# Patient Record
Sex: Female | Born: 1965 | Race: White | Hispanic: No | Marital: Married | Smoking: Never smoker
Health system: Southern US, Community
[De-identification: ages and names within clinical notes are randomized; demographics above are authoritative.]

## PROBLEM LIST (undated history)

## (undated) DIAGNOSIS — G4730 Sleep apnea, unspecified: Secondary | ICD-10-CM

## (undated) DIAGNOSIS — I10 Essential (primary) hypertension: Secondary | ICD-10-CM

## (undated) DIAGNOSIS — K219 Gastro-esophageal reflux disease without esophagitis: Secondary | ICD-10-CM

## (undated) HISTORY — PX: OTHER SURGICAL HISTORY: SHX169

## (undated) HISTORY — PX: TONSILLECTOMY: SUR1361

## (undated) HISTORY — PX: GANGLION CYST EXCISION: SHX1691

## (undated) HISTORY — DX: Essential (primary) hypertension: I10

## (undated) HISTORY — DX: Gastro-esophageal reflux disease without esophagitis: K21.9

## (undated) HISTORY — PX: CHOLECYSTECTOMY: SHX55

---

## 1997-09-18 ENCOUNTER — Encounter: Admission: RE | Admit: 1997-09-18 | Discharge: 1997-09-18 | Payer: Self-pay | Admitting: *Deleted

## 1997-10-30 ENCOUNTER — Other Ambulatory Visit: Admission: RE | Admit: 1997-10-30 | Discharge: 1997-10-30 | Payer: Self-pay | Admitting: Obstetrics and Gynecology

## 1998-01-03 ENCOUNTER — Ambulatory Visit (HOSPITAL_COMMUNITY): Admission: RE | Admit: 1998-01-03 | Discharge: 1998-01-03 | Payer: Self-pay | Admitting: Obstetrics and Gynecology

## 1998-01-03 ENCOUNTER — Encounter: Payer: Self-pay | Admitting: Obstetrics and Gynecology

## 1998-11-19 ENCOUNTER — Other Ambulatory Visit: Admission: RE | Admit: 1998-11-19 | Discharge: 1998-11-19 | Payer: Self-pay | Admitting: Obstetrics and Gynecology

## 1999-01-07 ENCOUNTER — Encounter: Payer: Self-pay | Admitting: Obstetrics and Gynecology

## 1999-01-07 ENCOUNTER — Ambulatory Visit (HOSPITAL_COMMUNITY): Admission: RE | Admit: 1999-01-07 | Discharge: 1999-01-07 | Payer: Self-pay | Admitting: Obstetrics and Gynecology

## 1999-01-10 ENCOUNTER — Encounter: Payer: Self-pay | Admitting: Obstetrics and Gynecology

## 1999-01-10 ENCOUNTER — Ambulatory Visit (HOSPITAL_COMMUNITY): Admission: RE | Admit: 1999-01-10 | Discharge: 1999-01-10 | Payer: Self-pay | Admitting: Obstetrics and Gynecology

## 1999-12-05 ENCOUNTER — Other Ambulatory Visit: Admission: RE | Admit: 1999-12-05 | Discharge: 1999-12-05 | Payer: Self-pay | Admitting: Obstetrics & Gynecology

## 2000-01-10 ENCOUNTER — Encounter: Payer: Self-pay | Admitting: Obstetrics and Gynecology

## 2000-01-10 ENCOUNTER — Ambulatory Visit (HOSPITAL_COMMUNITY): Admission: RE | Admit: 2000-01-10 | Discharge: 2000-01-10 | Payer: Self-pay | Admitting: Obstetrics and Gynecology

## 2000-12-04 ENCOUNTER — Other Ambulatory Visit: Admission: RE | Admit: 2000-12-04 | Discharge: 2000-12-04 | Payer: Self-pay | Admitting: Obstetrics and Gynecology

## 2001-02-23 ENCOUNTER — Ambulatory Visit (HOSPITAL_COMMUNITY): Admission: RE | Admit: 2001-02-23 | Discharge: 2001-02-23 | Payer: Self-pay | Admitting: Obstetrics and Gynecology

## 2001-02-23 ENCOUNTER — Encounter: Payer: Self-pay | Admitting: Obstetrics and Gynecology

## 2001-12-07 ENCOUNTER — Other Ambulatory Visit: Admission: RE | Admit: 2001-12-07 | Discharge: 2001-12-07 | Payer: Self-pay | Admitting: Obstetrics and Gynecology

## 2002-02-25 ENCOUNTER — Encounter: Payer: Self-pay | Admitting: Obstetrics and Gynecology

## 2002-02-25 ENCOUNTER — Ambulatory Visit (HOSPITAL_COMMUNITY): Admission: RE | Admit: 2002-02-25 | Discharge: 2002-02-25 | Payer: Self-pay | Admitting: Obstetrics and Gynecology

## 2002-12-14 ENCOUNTER — Other Ambulatory Visit: Admission: RE | Admit: 2002-12-14 | Discharge: 2002-12-14 | Payer: Self-pay | Admitting: Obstetrics and Gynecology

## 2003-02-27 ENCOUNTER — Ambulatory Visit (HOSPITAL_COMMUNITY): Admission: RE | Admit: 2003-02-27 | Discharge: 2003-02-27 | Payer: Self-pay | Admitting: Obstetrics and Gynecology

## 2004-08-09 ENCOUNTER — Other Ambulatory Visit: Admission: RE | Admit: 2004-08-09 | Discharge: 2004-08-09 | Payer: Self-pay | Admitting: Obstetrics and Gynecology

## 2005-08-11 ENCOUNTER — Other Ambulatory Visit: Admission: RE | Admit: 2005-08-11 | Discharge: 2005-08-11 | Payer: Self-pay | Admitting: Obstetrics and Gynecology

## 2010-02-10 ENCOUNTER — Encounter: Payer: Self-pay | Admitting: Obstetrics and Gynecology

## 2011-03-03 ENCOUNTER — Ambulatory Visit (HOSPITAL_BASED_OUTPATIENT_CLINIC_OR_DEPARTMENT_OTHER): Payer: Self-pay

## 2011-03-03 ENCOUNTER — Other Ambulatory Visit: Payer: Self-pay | Admitting: Obstetrics and Gynecology

## 2011-03-03 DIAGNOSIS — Z1231 Encounter for screening mammogram for malignant neoplasm of breast: Secondary | ICD-10-CM

## 2011-03-05 ENCOUNTER — Ambulatory Visit (HOSPITAL_BASED_OUTPATIENT_CLINIC_OR_DEPARTMENT_OTHER)
Admission: RE | Admit: 2011-03-05 | Discharge: 2011-03-05 | Disposition: A | Discharge status: Home / Self Care | Payer: 59 | Source: Ambulatory Visit | Attending: Obstetrics and Gynecology | Admitting: Obstetrics and Gynecology

## 2011-03-05 DIAGNOSIS — Z1231 Encounter for screening mammogram for malignant neoplasm of breast: Secondary | ICD-10-CM | POA: Insufficient documentation

## 2012-11-17 ENCOUNTER — Other Ambulatory Visit: Payer: Self-pay | Admitting: Obstetrics and Gynecology

## 2012-11-17 DIAGNOSIS — Z1231 Encounter for screening mammogram for malignant neoplasm of breast: Secondary | ICD-10-CM

## 2012-11-26 ENCOUNTER — Ambulatory Visit (HOSPITAL_BASED_OUTPATIENT_CLINIC_OR_DEPARTMENT_OTHER)
Admission: RE | Admit: 2012-11-26 | Discharge: 2012-11-26 | Disposition: A | Discharge status: Home / Self Care | Payer: 59 | Source: Ambulatory Visit | Attending: Obstetrics and Gynecology | Admitting: Obstetrics and Gynecology

## 2012-11-26 DIAGNOSIS — Z1231 Encounter for screening mammogram for malignant neoplasm of breast: Secondary | ICD-10-CM | POA: Insufficient documentation

## 2013-07-16 DIAGNOSIS — F5101 Primary insomnia: Secondary | ICD-10-CM | POA: Diagnosis present

## 2013-07-16 DIAGNOSIS — G4700 Insomnia, unspecified: Secondary | ICD-10-CM | POA: Insufficient documentation

## 2013-07-16 DIAGNOSIS — J302 Other seasonal allergic rhinitis: Secondary | ICD-10-CM | POA: Insufficient documentation

## 2013-07-16 DIAGNOSIS — K219 Gastro-esophageal reflux disease without esophagitis: Secondary | ICD-10-CM

## 2013-07-16 DIAGNOSIS — D134 Benign neoplasm of liver: Secondary | ICD-10-CM | POA: Insufficient documentation

## 2013-07-16 DIAGNOSIS — K439 Ventral hernia without obstruction or gangrene: Secondary | ICD-10-CM | POA: Insufficient documentation

## 2013-07-16 DIAGNOSIS — R002 Palpitations: Secondary | ICD-10-CM | POA: Insufficient documentation

## 2013-07-16 DIAGNOSIS — K432 Incisional hernia without obstruction or gangrene: Secondary | ICD-10-CM | POA: Diagnosis present

## 2013-07-16 DIAGNOSIS — I10 Essential (primary) hypertension: Secondary | ICD-10-CM | POA: Insufficient documentation

## 2014-01-17 ENCOUNTER — Other Ambulatory Visit (HOSPITAL_BASED_OUTPATIENT_CLINIC_OR_DEPARTMENT_OTHER): Payer: Self-pay | Admitting: Obstetrics and Gynecology

## 2014-01-17 ENCOUNTER — Ambulatory Visit (HOSPITAL_BASED_OUTPATIENT_CLINIC_OR_DEPARTMENT_OTHER)
Admission: RE | Admit: 2014-01-17 | Discharge: 2014-01-17 | Disposition: A | Discharge status: Home / Self Care | Payer: 59 | Source: Ambulatory Visit | Attending: Obstetrics and Gynecology | Admitting: Obstetrics and Gynecology

## 2014-01-17 DIAGNOSIS — Z1231 Encounter for screening mammogram for malignant neoplasm of breast: Secondary | ICD-10-CM | POA: Diagnosis present

## 2015-05-14 DIAGNOSIS — R0683 Snoring: Secondary | ICD-10-CM | POA: Insufficient documentation

## 2015-06-04 ENCOUNTER — Encounter: Payer: Self-pay | Admitting: Internal Medicine

## 2015-06-04 ENCOUNTER — Ambulatory Visit (INDEPENDENT_AMBULATORY_CARE_PROVIDER_SITE_OTHER): Payer: Managed Care, Other (non HMO) | Admitting: Internal Medicine

## 2015-06-04 VITALS — BP 136/88 | HR 88 | Temp 98.4°F | Resp 16 | Ht 65.5 in | Wt 254.6 lb

## 2015-06-04 DIAGNOSIS — J310 Chronic rhinitis: Secondary | ICD-10-CM | POA: Diagnosis not present

## 2015-06-04 LAB — CBC WITH DIFFERENTIAL/PLATELET
BASOS PCT: 1 %
Basophils Absolute: 63 cells/uL (ref 0–200)
EOS PCT: 2 %
Eosinophils Absolute: 126 cells/uL (ref 15–500)
HCT: 35 % (ref 35.0–45.0)
HEMOGLOBIN: 11.4 g/dL — AB (ref 11.7–15.5)
LYMPHS ABS: 1575 {cells}/uL (ref 850–3900)
LYMPHS PCT: 25 %
MCH: 27 pg (ref 27.0–33.0)
MCHC: 32.6 g/dL (ref 32.0–36.0)
MCV: 82.9 fL (ref 80.0–100.0)
MPV: 9.2 fL (ref 7.5–12.5)
Monocytes Absolute: 441 cells/uL (ref 200–950)
Monocytes Relative: 7 %
NEUTROS PCT: 65 %
Neutro Abs: 4095 cells/uL (ref 1500–7800)
Platelets: 289 10*3/uL (ref 140–400)
RBC: 4.22 MIL/uL (ref 3.80–5.10)
RDW: 14.7 % (ref 11.0–15.0)
WBC: 6.3 10*3/uL (ref 3.8–10.8)

## 2015-06-04 LAB — COMPLETE METABOLIC PANEL WITH GFR
ALT: 26 U/L (ref 6–29)
AST: 22 U/L (ref 10–35)
Albumin: 4 g/dL (ref 3.6–5.1)
Alkaline Phosphatase: 73 U/L (ref 33–130)
BUN: 10 mg/dL (ref 7–25)
CHLORIDE: 102 mmol/L (ref 98–110)
CO2: 22 mmol/L (ref 20–31)
Calcium: 9 mg/dL (ref 8.6–10.4)
Creat: 0.66 mg/dL (ref 0.50–1.05)
GFR, Est African American: >89 mL/min (ref >=60)
GFR, Est Non African American: >89 mL/min (ref >=60)
GLUCOSE: 123 mg/dL — AB (ref 65–99)
POTASSIUM: 3.9 mmol/L (ref 3.5–5.3)
SODIUM: 139 mmol/L (ref 135–146)
Total Bilirubin: 0.4 mg/dL (ref 0.2–1.2)
Total Protein: 6.4 g/dL (ref 6.1–8.1)

## 2015-06-04 MED ORDER — LEVOCETIRIZINE DIHYDROCHLORIDE 5 MG PO TABS: 5.0000 mg | ORAL_TABLET | Freq: Every evening | ORAL | Status: DC

## 2015-06-04 MED ORDER — OLOPATADINE HCL 0.6 % NA SOLN: 2.0000 | Freq: Two times a day (BID) | NASAL | Status: DC

## 2015-06-04 MED ORDER — OLOPATADINE HCL 0.6 % NA SOLN
2.0000 | Freq: Two times a day (BID) | NASAL | Status: DC
Start: 2015-06-04 — End: 2015-06-04

## 2015-06-04 NOTE — Assessment & Plan Note (Addendum)
   Start Xyzal 5 mg daily, Patanase 2 spray each nostril twice a day, ketotifen eyedrops 1 drop each eye twice a day  Continue Nasacort 2 sprays each nostril daily, Singulair 10 mg daily  Screen immune system. Check CBC with differential, CMP, ESR, total Ig G/A/M/E, tetanus/pneumococcal/diphtheria titers. She has never had a Pneumovax

## 2015-06-04 NOTE — Progress Notes (Signed)
Referring provider: No referring provider defined for this encounter.  History of Present Illness:  Theresa Gilbert is a 50 y.o. female presenting for evaluation of rhinitis  HPI Comments: Allergic rhinitis: Patient had testing is 17 years ago in [place] and was reportedly positive for dust mite, feathers, mold. She is taking Zyrtec, Singulair, Nasacort or Flonase all year but continues to have breakthrough symptoms. She has sinus infections a few times a year requiring antibiotics. She does not have any other types of infections. She was never on allergy injections. She saw an ear nose and throat physician one and a half years ago who perform sinus imaging that was reportedly negative with the exception of mildly enlarged adenoids. She has unrestful sleep and a sleep study is pending for next month.   Assessment and Plan: Chronic rhinitis  Start Xyzal 5 mg daily, Patanase 2 spray each nostril twice a day, ketotifen eyedrops 1 drop each eye twice a day  Continue Nasacort 2 sprays each nostril daily, Singulair 10 mg daily  Screen immune system. Check CBC with differential, CMP, ESR, total Ig G/A/M/E, tetanus/pneumococcal/diphtheria titers. She has never had a Pneumovax    Return in about 6 months (around 12/05/2015).  No current outpatient prescriptions on file prior to visit.   No current facility-administered medications on file prior to visit.    Meds ordered this encounter  Medications  . losartan (COZAAR) 25 MG tablet    Sig: Take 25 mg by mouth daily.  Marland Kitchen esomeprazole (NEXIUM) 20 MG capsule    Sig: Take 20 mg by mouth 2 (two) times daily.  . montelukast (SINGULAIR) 10 MG tablet    Sig: Take 10 mg by mouth at bedtime.  Marland Kitchen DISCONTD: cetirizine (ZYRTEC) 10 MG tablet    Sig: Take 10 mg by mouth daily.  . Calcium Citrate-Vitamin D (CALCIUM + D PO)    Sig: Take by mouth.  . Multiple Vitamins-Minerals (MULTIVITAMIN GUMMIES ADULTS PO)    Sig: Take by mouth.  .  Cyanocobalamin (VITAMIN B-12 PO)    Sig: Take by mouth.  . DISCONTD: levocetirizine (XYZAL) 5 MG tablet    Sig: Take 1 tablet (5 mg total) by mouth every evening.    Dispense:  60 tablet    Refill:  0    For runny nose or itching. Dispense 60 day supply.  Marland Kitchen DISCONTD: Olopatadine HCl 0.6 % SOLN    Sig: Place 2 drops (2 puffs total) into both nostrils 2 (two) times daily.    Dispense:  2 Bottle    Refill:  0    Dispense 60 supply. For runny nose  . DISCONTD: Olopatadine HCl 0.6 % SOLN    Sig: Place 2 drops (2 puffs total) into both nostrils 2 (two) times daily.    Dispense:  2 Bottle    Refill:  0    Dispense 60 supply. For runny nose  . levocetirizine (XYZAL) 5 MG tablet    Sig: Take 1 tablet (5 mg total) by mouth every evening.    Dispense:  30 tablet    Refill:  0    For runny nose or itching.  Marland Kitchen DISCONTD: Olopatadine HCl 0.6 % SOLN    Sig: Place 2 drops (2 puffs total) into both nostrils 2 (two) times daily.    Dispense:  1 Bottle    Refill:  0    For runny nose.  Marland Kitchen Olopatadine HCl 0.6 % SOLN    Sig: Place 2 drops (2 puffs total) into  both nostrils 2 (two) times daily.    Dispense:  1 Bottle    Refill:  0    For runny nose.    Diagnostics: Aeroallergen skin testing: Borderline positive to 1 weed with a good histamine control Food allergy skin testing: Negative with a good histamine control  Skin tests were interpreted by me, transferred into [facility] by CMA, reviewed and accepted by me into EP[facility].  Physical Exam: BP 136/88 mmHg  Pulse 88  Temp(Src) 98.4 F (36.9 C) (Oral)  Resp 16  Ht 5' 5.5" (1.664 m)  Wt 254 lb 9.6 oz (115.486 kg)  BMI 41.71 kg/m2   Physical Exam  Constitutional: She appears well-developed and well-nourished. No distress.  HENT:  Right Ear: External ear normal.  Left Ear: External ear normal.  Nose: Nose normal.  Mouth/Throat: Oropharynx is clear and moist.  Eyes: Conjunctivae are normal. Right eye exhibits no discharge. Left eye exhibits no  discharge.  Cardiovascular: Normal rate, regular rhythm and normal heart sounds.   No murmur heard. Pulmonary/Chest: Effort normal and breath sounds normal. No respiratory distress. She has no wheezes. She has no rales.  Abdominal: Soft. Bowel sounds are normal.  Musculoskeletal: She exhibits no edema.  Lymphadenopathy:    She has no cervical adenopathy.  Neurological: She is alert.  Skin: No rash noted.  Vitals reviewed.   Review of systems: Per HPI unless specifically indicated below Review of Systems  Constitutional: Negative for fever, chills, appetite change and unexpected weight change.  HENT: Positive for congestion, postnasal drip, rhinorrhea, sinus pressure and sneezing. Negative for ear pain and sore throat.   Eyes: Positive for discharge and itching. Negative for pain.  Respiratory: Negative for cough, chest tightness and wheezing.   Cardiovascular: Negative for chest pain and leg swelling.  Gastrointestinal: Negative for vomiting and diarrhea.  Genitourinary: Negative for difficulty urinating.  Musculoskeletal: Negative for joint swelling and arthralgias.  Skin: Negative for rash.  Allergic/Immunologic: Positive for environmental allergies. Negative for food allergies and immunocompromised state.       Stung by bee wasp yellow jacket - no problem No latex allergy  Neurological: Negative for seizures.    Patient Active Problem List   Diagnosis Date Noted  . Chronic rhinitis 06/04/2015    Past Surgical History  Procedure Laterality Date  . Cholecytectomy    . Ganglion cyst excision Left     wrist,  . Arthritic cyst Left     wrist  . Wisdom teeth extaction      Family History  Problem Relation Age of Onset  . Cancer Mother   . Diabetes Mother   . Heart disease Father   . Cancer Sister   . Heart disease Maternal Grandmother   . Heart disease Maternal Grandfather     Environmental/Social history: She lives in a house that is 52 years of age, there is no  basement in the home, there is carpet in the bedroom, there is central air conditioning and heating, there are no pets in the home, there are allergy covers over the mattress and pillow, she is a lifelong nonsmoker, she works in an office as a IT sales professional.  Drug Allergies:  No Known Allergies  Thank you for the opportunity to care for this patient.  Please do not hesitate to contact me with questions.

## 2015-06-04 NOTE — Patient Instructions (Addendum)
Chronic rhinitis  Start Xyzal 5 mg daily, Patanase 2 spray each nostril twice a day, ketotifen eyedrops 1 drop each eye twice a day  Continue Nasacort 2 sprays each nostril daily, Singulair 10 mg daily  Screen immune system. Check CBC with differential, CMP, ESR, total IgG G/A/M/E, tetanus/pneumococcal/diphtheria titers. She has never had a Pneumovax

## 2015-06-05 LAB — SEDIMENTATION RATE: Sed Rate: 9 mm/hr (ref 0–30)

## 2015-06-05 LAB — IGM: IGM, SERUM: 82 mg/dL (ref 48–271)

## 2015-06-05 LAB — IGG: IgG (Immunoglobin G), Serum: 1101 mg/dL (ref 694–1618)

## 2015-06-05 LAB — IGA: IGA: 125 mg/dL (ref 81–463)

## 2015-06-05 LAB — IGE: IgE (Immunoglobulin E), Serum: 35 kU/L (ref <115)

## 2015-06-06 LAB — DIPHTHERIA / TETANUS ANTIBODY PANEL
DIPHTHERIA AB: 0.52 [IU]/mL
TETANUS TOXIN ANTIBODY, TOTAL: 3.04 [IU]/mL (ref >0.15)

## 2015-06-11 LAB — STREP PNEUMONIAE 23 SEROTYPES IGG
SEROTYPE 1 (1): 3.2 ug/mL
SEROTYPE 12 (12F): 0.9 ug/mL
SEROTYPE 14 (14): 0.3 ug/mL
SEROTYPE 19 (19F): 2.5 ug/mL
SEROTYPE 2 (2): 1.3 ug/mL
SEROTYPE 22 (22F): 0.3 ug/mL
SEROTYPE 26 (6B): 0.4 ug/mL
SEROTYPE 57 (19A): 0.3 ug/mL
SEROTYPE 70 (33F): 1.8 ug/mL
Serotype 17 (17F): 0.7 ug/mL
Serotype 23 (23F): 1 ug/mL
Serotype 3 (3): <0.3 ug/mL
Serotype 34 (10A): 2.8 ug/mL
Serotype 4 (4): 0.7 ug/mL
Serotype 43 (11A): 0.5 ug/mL
Serotype 5 (5): 3.6 ug/mL
Serotype 51 (7F): <0.3 ug/mL
Serotype 56 (18C): 4.2 ug/mL
Serotype 8 (8): 0.9 ug/mL
Serotype 9 (9N): <0.3 ug/mL

## 2015-06-11 LAB — STREP PNEUMONIAE 14 SEROTYPES IGG
STREP PNEUMO TYPE 19: 2.5 ug/mL
STREP PNEUMO TYPE 4: 0.7 ug/mL
STREP PNEUMONIAE TYPE 6B ABS: 0.4 ug/mL
STREP PNEUMONIAE TYPE 8 ABS: 0.9 ug/mL
Strep pneumo Type 12: 0.9 ug/mL
Strep pneumoniae Type 1 Abs: 3.2 ug/mL
Strep pneumoniae Type 14 Abs: 0.3 ug/mL
Strep pneumoniae Type 18C Abs: 4.2 ug/mL
Strep pneumoniae Type 23F Abs: 1 ug/mL
Strep pneumoniae Type 5 Abs: 3.6 ug/mL
Strep pneumoniae Type 7F Abs: <0.3 ug/mL
Strep pneumoniae Type 9N Abs: <0.3 ug/mL

## 2015-07-25 ENCOUNTER — Telehealth: Payer: Self-pay | Admitting: Internal Medicine

## 2015-09-16 ENCOUNTER — Emergency Department
Admission: EM | Admit: 2015-09-16 | Discharge: 2015-09-16 | Disposition: A | Discharge status: Home / Self Care | Payer: 59 | Attending: Student in an Organized Health Care Education/Training Program | Admitting: Student in an Organized Health Care Education/Training Program

## 2015-09-16 ENCOUNTER — Encounter: Payer: Self-pay | Admitting: Emergency Medicine

## 2015-09-16 ENCOUNTER — Emergency Department: Payer: 59

## 2015-09-16 DIAGNOSIS — Z79899 Other long term (current) drug therapy: Secondary | ICD-10-CM | POA: Insufficient documentation

## 2015-09-16 DIAGNOSIS — R079 Chest pain, unspecified: Secondary | ICD-10-CM | POA: Insufficient documentation

## 2015-09-16 DIAGNOSIS — I10 Essential (primary) hypertension: Secondary | ICD-10-CM | POA: Insufficient documentation

## 2015-09-16 DIAGNOSIS — R002 Palpitations: Secondary | ICD-10-CM | POA: Diagnosis present

## 2015-09-16 LAB — CBC
HEMATOCRIT: 37 % (ref 35.0–47.0)
HEMOGLOBIN: 12.7 g/dL (ref 12.0–16.0)
MCH: 28.6 pg (ref 26.0–34.0)
MCHC: 34.4 g/dL (ref 32.0–36.0)
MCV: 83 fL (ref 80.0–100.0)
Platelets: 227 10*3/uL (ref 150–440)
RBC: 4.45 MIL/uL (ref 3.80–5.20)
RDW: 14.5 % (ref 11.5–14.5)
WBC: 5 10*3/uL (ref 3.6–11.0)

## 2015-09-16 LAB — BASIC METABOLIC PANEL
Anion gap: 6 (ref 5–15)
BUN: 14 mg/dL (ref 6–20)
CHLORIDE: 105 mmol/L (ref 101–111)
CO2: 27 mmol/L (ref 22–32)
CREATININE: 0.74 mg/dL (ref 0.44–1.00)
Calcium: 9.3 mg/dL (ref 8.9–10.3)
GFR calc non Af Amer: >60 mL/min (ref >60)
Glucose, Bld: 120 mg/dL — ABNORMAL HIGH (ref 65–99)
POTASSIUM: 3.8 mmol/L (ref 3.5–5.1)
SODIUM: 138 mmol/L (ref 135–145)

## 2015-09-16 LAB — MAGNESIUM: MAGNESIUM: 2 mg/dL (ref 1.7–2.4)

## 2015-09-16 LAB — TROPONIN I: Troponin I: <0.03 ng/mL (ref <0.03)

## 2015-09-16 LAB — FIBRIN DERIVATIVES D-DIMER (ARMC ONLY): FIBRIN DERIVATIVES D-DIMER (ARMC): 372 (ref 0–499)

## 2015-09-16 MED ORDER — GI COCKTAIL ~~LOC~~
30.0000 mL | Freq: Once | ORAL | Status: AC
  Administered 2015-09-16: 30 mL via ORAL
  Filled 2015-09-16: qty 30

## 2015-09-16 MED ORDER — SODIUM CHLORIDE 0.9 % IV BOLUS (SEPSIS)
1000.0000 mL | Freq: Once | INTRAVENOUS | Status: AC
  Administered 2015-09-16: 1000 mL via INTRAVENOUS

## 2015-09-16 NOTE — ED Provider Notes (Signed)
[facility] Emergency Department Provider Note    First MD Initiated Contact with Patient 09/16/15 1015     (approximate)  I have reviewed the triage vital signs and the nursing notes.   HISTORY  Chief Complaint Chest Pain    HPI Theresa Gilbert is a 50 y.o. female with a history of GERD and hypertension presents with 15 minutes of midsternal pressure, nausea and lightheadedness and palpitations that occurred roughly at 10:00 while the patient was driving to the flea market. Patient has never had these symptoms before. Patient states that she is undergoing a weight loss program and has had worsening reflux symptoms over the past 2 weeks but this felt different. Does not have any history of dysrhythmia. No recent medication changes. Does not feel short of breath at this time. States that she was going on a hike this morning with her husband and denied any chest pain or shortness of breath at that time. Since she arrived to the ER she has been asymptomatic. She is primarily concerned that she was having a heart attack. She does not smoke. Does not have a history of blood clots.   Past Medical History:  Diagnosis Date  . GERD (gastroesophageal reflux disease)   . Hypertension     Patient Active Problem List   Diagnosis Date Noted  . Chronic rhinitis 06/04/2015    Past Surgical History:  Procedure Laterality Date  . arthritic cyst Left    wrist  . CHOLECYSTECTOMY    . cholecytectomy    . GANGLION CYST EXCISION Left    wrist,  . wisdom teeth extaction      Prior to Admission medications   Medication Sig Start Date End Date Taking? Authorizing Provider  Calcium Citrate-Vitamin D (CALCIUM + D PO) Take by mouth.    Historical Provider, MD  Cyanocobalamin (VITAMIN B-12 PO) Take by mouth.    Historical Provider, MD  esomeprazole (NEXIUM) 20 MG capsule Take 20 mg by mouth 2 (two) times daily.    Historical Provider, MD  levocetirizine (XYZAL) 5 MG  tablet Take 1 tablet (5 mg total) by mouth every evening. 06/04/15   Leda Roys, MD  losartan (COZAAR) 25 MG tablet Take 25 mg by mouth daily.    Historical Provider, MD  montelukast (SINGULAIR) 10 MG tablet Take 10 mg by mouth at bedtime.    Historical Provider, MD  Multiple Vitamins-Minerals (MULTIVITAMIN GUMMIES ADULTS PO) Take by mouth.    Historical Provider, MD  Olopatadine HCl 0.6 % SOLN Place 2 drops (2 puffs total) into both nostrils 2 (two) times daily. 06/04/15   Leda Roys, MD    Allergies Review of patient's allergies indicates no known allergies.  Family History  Problem Relation Age of Onset  . Cancer Mother   . Diabetes Mother   . Heart disease Father   . Cancer Sister   . Heart disease Maternal Grandmother   . Heart disease Maternal Grandfather     Social History Social History  Substance Use Topics  . Smoking status: Never Smoker  . Smokeless tobacco: Never Used  . Alcohol use Yes    Review of Systems Patient denies headaches, rhinorrhea, blurry vision, numbness, shortness of breath, chest pain, edema, cough, abdominal pain, nausea, vomiting, diarrhea, dysuria, fevers, rashes or hallucinations unless otherwise stated above in HPI. ____________________________________________   PHYSICAL EXAM:  VITAL SIGNS: Vitals:   09/16/15 1009  BP: 135/83  Pulse: 89  Resp: 16  Temp: 97.8 F (36.6  C)    Constitutional: Alert and oriented. Well appearing and in no acute distress. Eyes: Conjunctivae are normal. PERRL. EOMI. Head: Atraumatic. Nose: No congestion/rhinnorhea. Mouth/Throat: Mucous membranes are moist.  Oropharynx non-erythematous. Neck: No stridor. Painless ROM. No cervical spine tenderness to palpation Hematological/Lymphatic/Immunilogical: No cervical lymphadenopathy. Cardiovascular: Normal rate, regular rhythm. Grossly normal heart sounds.  Good peripheral circulation. Respiratory: Normal respiratory effort.  No retractions. Lungs  CTAB. Gastrointestinal: Soft and nontender. No distention. No abdominal bruits. No CVA tenderness. Genitourinary:  Musculoskeletal: No lower extremity tenderness nor edema.  No joint effusions. Neurologic:  Normal speech and language. No gross focal neurologic deficits are appreciated. No gait instability. Skin:  Skin is warm, dry and intact. No rash noted. Psychiatric: Mood and affect are normal. Speech and behavior are normal.  ____________________________________________   LABS (all labs ordered are listed, but only abnormal results are displayed)  Results for orders placed or performed during the hospital encounter of 09/16/15 (from the past 24 hour(s))  Basic metabolic panel     Status: Abnormal   Collection Time: 09/16/15 10:13 AM  Result Value Ref Range   Sodium 138 135 - 145 mmol/L   Potassium 3.8 3.5 - 5.1 mmol/L   Chloride 105 101 - 111 mmol/L   CO2 27 22 - 32 mmol/L   Glucose, Bld 120 (H) 65 - 99 mg/dL   BUN 14 6 - 20 mg/dL   Creatinine, Ser 0.74 0.44 - 1.00 mg/dL   Calcium 9.3 8.9 - 10.3 mg/dL   GFR calc non Af Amer >60 >60 mL/min   GFR calc Af Amer >60 >60 mL/min   Anion gap 6 5 - 15  CBC     Status: None   Collection Time: 09/16/15 10:13 AM  Result Value Ref Range   WBC 5.0 3.6 - 11.0 K/uL   RBC 4.45 3.80 - 5.20 MIL/uL   Hemoglobin 12.7 12.0 - 16.0 g/dL   HCT 37.0 35.0 - 47.0 %   MCV 83.0 80.0 - 100.0 fL   MCH 28.6 26.0 - 34.0 pg   MCHC 34.4 32.0 - 36.0 g/dL   RDW 14.5 11.5 - 14.5 %   Platelets 227 150 - 440 K/uL  Troponin I     Status: None   Collection Time: 09/16/15 10:13 AM  Result Value Ref Range   Troponin I <0.03 <0.03 ng/mL   ____________________________________________  EKG My review and personal interpretation at Time: 10:06   Indication: chest pain  Rate: 90  Rhythm: nsr Axis: normal Other: no wpw, brugada or acute ischemic changes ____________________________________________  RADIOLOGY  CXR my read shows no evidence of acute  cardiopulmonary process.  ____________________________________________   PROCEDURES  Procedure(s) performed: none    Critical Care performed: no ____________________________________________   INITIAL IMPRESSION / ASSESSMENT AND PLAN / ED COURSE  Pertinent labs & imaging results that were available during my care of the patient were reviewed by me and considered in my medical decision making (see chart for details).  DDX: acs, pericarditis, pe, chf, dysrhythmia, dehydration, gerd, anxiety  SHOSHANA BRICE is a 50 y.o. who presents to the ED with complaint of acute shortness of breath and chest pain. Patient is low risk for heart score based on history and comorbidities. Heart score is 3. Patient is low risk Wells. We will send a d-dimer to further risk stratify for pulmonary embolism. She is currently afebrile and hemodynamically stable do not suspect acute infectious process. Presentation is not consistent with dissection as she has  no residual symptoms or noted her neuro deficits. Chest x-ray does not show any evidence of infiltrate or mass. Her abdominal exam is soft and benign. Patient does have worsening GERD symptoms over the past 2 weeks. This may be worsening episode of similar symptoms. Based on her presentation at 10:00 will further risk stratify for ACS with repeat EKG and troponin at 3 hours.  Clinical Course  Comment By Time  D-dimer is negative for acute DVT. Do not feel CT imaging clinically indicated. We'll repeat EKG and troponin to further risk stratify for ACS. Merlyn Lot, MD 08/27 1221  Repeat EKG is without acute changes. Merlyn Lot, MD 08/27 1238  Repeat troponin was negative patient continues to be asymptomatic. Feel patient is appropriate for discharge home. Patient has follow-up point with her primary care physician on Tuesday. Discussed signs and symptoms for which the patient should return emergently to the hospital.  Patient was able to  tolerate PO and was able to ambulate with a steady gait.  Merlyn Lot, MD 08/27 1321     ____________________________________________   FINAL CLINICAL IMPRESSION(S) / ED DIAGNOSES  Final diagnoses:  Chest pain, unspecified chest pain type      NEW MEDICATIONS STARTED DURING THIS VISIT:  New Prescriptions   No medications on file     Note:  This document was prepared using Dragon voice recognition software and may include unintentional dictation errors.    Merlyn Lot, MD 09/16/15 1324

## 2015-09-16 NOTE — ED Triage Notes (Signed)
C/O heart palpitations, onset of symptoms this morning.  With palpitations patient became diaphoretic and felt SOB

## 2015-09-16 NOTE — ED Notes (Signed)
Patient is a patient at [facility] bariatric clinic and is in their 12 week program.  States she has lost 35 pounds while in the program.

## 2015-09-19 NOTE — Telephone Encounter (Signed)
Made in error

## 2017-08-25 ENCOUNTER — Other Ambulatory Visit (HOSPITAL_BASED_OUTPATIENT_CLINIC_OR_DEPARTMENT_OTHER): Payer: Self-pay | Admitting: Obstetrics and Gynecology

## 2017-08-25 DIAGNOSIS — Z1231 Encounter for screening mammogram for malignant neoplasm of breast: Secondary | ICD-10-CM

## 2017-08-27 ENCOUNTER — Ambulatory Visit (HOSPITAL_BASED_OUTPATIENT_CLINIC_OR_DEPARTMENT_OTHER)
Admission: RE | Admit: 2017-08-27 | Discharge: 2017-08-27 | Disposition: A | Discharge status: Home / Self Care | Payer: 59 | Source: Ambulatory Visit | Attending: Obstetrics and Gynecology | Admitting: Obstetrics and Gynecology

## 2017-08-27 DIAGNOSIS — Z1231 Encounter for screening mammogram for malignant neoplasm of breast: Secondary | ICD-10-CM | POA: Insufficient documentation

## 2017-09-25 DIAGNOSIS — K635 Polyp of colon: Secondary | ICD-10-CM | POA: Insufficient documentation

## 2017-11-03 DIAGNOSIS — G4733 Obstructive sleep apnea (adult) (pediatric): Secondary | ICD-10-CM | POA: Diagnosis present

## 2017-11-20 HISTORY — PX: TURBINATE REDUCTION: SHX6157

## 2018-05-31 ENCOUNTER — Other Ambulatory Visit: Payer: Self-pay

## 2018-05-31 ENCOUNTER — Encounter (HOSPITAL_BASED_OUTPATIENT_CLINIC_OR_DEPARTMENT_OTHER): Payer: Self-pay | Admitting: *Deleted

## 2018-05-31 ENCOUNTER — Emergency Department (HOSPITAL_BASED_OUTPATIENT_CLINIC_OR_DEPARTMENT_OTHER): Payer: POS

## 2018-05-31 ENCOUNTER — Emergency Department (HOSPITAL_BASED_OUTPATIENT_CLINIC_OR_DEPARTMENT_OTHER)
Admission: EM | Admit: 2018-05-31 | Discharge: 2018-05-31 | Disposition: A | Discharge status: Home / Self Care | Payer: POS | Source: Home / Self Care | Attending: Emergency Medicine | Admitting: Emergency Medicine

## 2018-05-31 DIAGNOSIS — Z79899 Other long term (current) drug therapy: Secondary | ICD-10-CM | POA: Insufficient documentation

## 2018-05-31 DIAGNOSIS — R1013 Epigastric pain: Secondary | ICD-10-CM | POA: Insufficient documentation

## 2018-05-31 DIAGNOSIS — K440 Diaphragmatic hernia with obstruction, without gangrene: Secondary | ICD-10-CM | POA: Diagnosis not present

## 2018-05-31 DIAGNOSIS — R112 Nausea with vomiting, unspecified: Secondary | ICD-10-CM | POA: Insufficient documentation

## 2018-05-31 DIAGNOSIS — I10 Essential (primary) hypertension: Secondary | ICD-10-CM | POA: Insufficient documentation

## 2018-05-31 HISTORY — DX: Sleep apnea, unspecified: G47.30

## 2018-05-31 LAB — URINALYSIS, ROUTINE W REFLEX MICROSCOPIC
Bilirubin Urine: NEGATIVE
Glucose, UA: NEGATIVE mg/dL
Hgb urine dipstick: NEGATIVE
Ketones, ur: 15 mg/dL — AB
Leukocytes,Ua: NEGATIVE
Nitrite: NEGATIVE
Protein, ur: NEGATIVE mg/dL
Specific Gravity, Urine: >1.03 — ABNORMAL HIGH (ref 1.005–1.030)
pH: 6 (ref 5.0–8.0)

## 2018-05-31 LAB — CBC WITH DIFFERENTIAL/PLATELET
Abs Immature Granulocytes: 0.03 10*3/uL (ref 0.00–0.07)
Basophils Absolute: 0 10*3/uL (ref 0.0–0.1)
Basophils Relative: 0 %
Eosinophils Absolute: 0 10*3/uL (ref 0.0–0.5)
Eosinophils Relative: 0 %
HCT: 37.6 % (ref 36.0–46.0)
Hemoglobin: 11.9 g/dL — ABNORMAL LOW (ref 12.0–15.0)
Immature Granulocytes: 0 %
Lymphocytes Relative: 11 %
Lymphs Abs: 1 10*3/uL (ref 0.7–4.0)
MCH: 27.3 pg (ref 26.0–34.0)
MCHC: 31.6 g/dL (ref 30.0–36.0)
MCV: 86.2 fL (ref 80.0–100.0)
Monocytes Absolute: 0.5 10*3/uL (ref 0.1–1.0)
Monocytes Relative: 6 %
Neutro Abs: 8 10*3/uL — ABNORMAL HIGH (ref 1.7–7.7)
Neutrophils Relative %: 83 %
Platelets: 290 10*3/uL (ref 150–400)
RBC: 4.36 MIL/uL (ref 3.87–5.11)
RDW: 13.5 % (ref 11.5–15.5)
WBC: 9.5 10*3/uL (ref 4.0–10.5)
nRBC: 0 % (ref 0.0–0.2)

## 2018-05-31 LAB — COMPREHENSIVE METABOLIC PANEL
ALT: 19 U/L (ref 0–44)
AST: 21 U/L (ref 15–41)
Albumin: 4.3 g/dL (ref 3.5–5.0)
Alkaline Phosphatase: 77 U/L (ref 38–126)
Anion gap: 10 (ref 5–15)
BUN: 14 mg/dL (ref 6–20)
CO2: 26 mmol/L (ref 22–32)
Calcium: 9.5 mg/dL (ref 8.9–10.3)
Chloride: 106 mmol/L (ref 98–111)
Creatinine, Ser: 0.61 mg/dL (ref 0.44–1.00)
GFR calc Af Amer: >60 mL/min (ref >60)
GFR calc non Af Amer: >60 mL/min (ref >60)
Glucose, Bld: 127 mg/dL — ABNORMAL HIGH (ref 70–99)
Potassium: 5.1 mmol/L (ref 3.5–5.1)
Sodium: 142 mmol/L (ref 135–145)
Total Bilirubin: 0.5 mg/dL (ref 0.3–1.2)
Total Protein: 7.4 g/dL (ref 6.5–8.1)

## 2018-05-31 LAB — LIPASE, BLOOD: Lipase: 31 U/L (ref 11–51)

## 2018-05-31 MED ORDER — SUCRALFATE 1 G PO TABS: 1.0000 g | ORAL_TABLET | Freq: Three times a day (TID) | ORAL | 0 refills | Status: DC

## 2018-05-31 MED ORDER — ONDANSETRON HCL 4 MG/2ML IJ SOLN
4.0000 mg | Freq: Once | INTRAMUSCULAR | Status: AC
  Administered 2018-05-31: 4 mg via INTRAVENOUS
  Filled 2018-05-31: qty 2

## 2018-05-31 MED ORDER — SUCRALFATE 1 G PO TABS: 1.0000 g | ORAL_TABLET | Freq: Three times a day (TID) | ORAL | 0 refills | Status: AC

## 2018-05-31 MED ORDER — SIMETHICONE 40 MG/0.6ML PO SUSP (UNIT DOSE)
40.0000 mg | Freq: Once | ORAL | Status: AC
  Administered 2018-05-31: 40 mg via ORAL
  Filled 2018-05-31: qty 0.6

## 2018-05-31 MED ORDER — LIDOCAINE VISCOUS HCL 2 % MT SOLN
15.0000 mL | Freq: Once | OROMUCOSAL | Status: AC
  Administered 2018-05-31: 15 mL via ORAL
  Filled 2018-05-31: qty 15

## 2018-05-31 MED ORDER — IOHEXOL 300 MG/ML  SOLN
100.0000 mL | Freq: Once | INTRAMUSCULAR | Status: AC | PRN
  Administered 2018-05-31: 100 mL via INTRAVENOUS

## 2018-05-31 MED ORDER — ONDANSETRON 4 MG PO TBDP: 4.0000 mg | ORAL_TABLET | Freq: Three times a day (TID) | ORAL | 0 refills | Status: DC | PRN

## 2018-05-31 MED ORDER — ALUM & MAG HYDROXIDE-SIMETH 200-200-20 MG/5ML PO SUSP
30.0000 mL | Freq: Once | ORAL | Status: AC
  Administered 2018-05-31: 30 mL via ORAL
  Filled 2018-05-31: qty 30

## 2018-05-31 MED ORDER — ONDANSETRON HCL 4 MG/2ML IJ SOLN
4.0000 mg | Freq: Once | INTRAMUSCULAR | Status: AC
  Administered 2018-05-31: 21:00:00 4 mg via INTRAVENOUS
  Filled 2018-05-31: qty 2

## 2018-05-31 MED ORDER — SODIUM CHLORIDE 0.9 % IV BOLUS
1000.0000 mL | Freq: Once | INTRAVENOUS | Status: AC
  Administered 2018-05-31: 1000 mL via INTRAVENOUS

## 2018-05-31 MED ORDER — MORPHINE SULFATE (PF) 4 MG/ML IV SOLN
4.0000 mg | Freq: Once | INTRAVENOUS | Status: AC
  Administered 2018-05-31: 4 mg via INTRAVENOUS
  Filled 2018-05-31: qty 1

## 2018-05-31 NOTE — ED Notes (Signed)
Patient transported to CT 

## 2018-05-31 NOTE — ED Triage Notes (Addendum)
Pt co/ epigastric pain after eating pizza and 2 fiber one bars  x 4 hrs ago , reports nausea

## 2018-05-31 NOTE — ED Notes (Signed)
ED Provider at bedside. 

## 2018-05-31 NOTE — ED Notes (Signed)
Pt vomited following medication administration

## 2018-05-31 NOTE — Discharge Instructions (Signed)
Evaluated today for epigastric pain.  This is likely combination of reflux or possible gastric ulcer.  Please continue to take your Nexium.  Have also given you prescription for Zofran which you may take for nausea.  This is a disintegrating tablet that you placed under your tongue.  Also given you a medication called Carafate for possible peptic ulcer.  Your lab work as well as your CT scan was unremarkable, however you do have a hiatal hernia which may be the cause of your persistent reflux.  I referred you to general surgery whom you may follow-up outpatient if you desire surgical correction of this.  If you develop bloody vomiting, bloody diarrhea, increased abdominal pain please seek reevaluation.

## 2018-05-31 NOTE — ED Notes (Signed)
Pt verbalizes understanding of d/c instructions and denies any further needs at this time. 

## 2018-05-31 NOTE — ED Notes (Signed)
Tolerating PO fluids °

## 2018-05-31 NOTE — ED Notes (Signed)
Attempted two IV sticks without success 

## 2018-05-31 NOTE — ED Provider Notes (Signed)
[facility] Provider Note   CSN: [id number] Arrival date & time: 05/31/18  1723  History   Chief Complaint Chief Complaint  Patient presents with  . Abdominal Pain   HPI Theresa Gilbert is a 53 y.o. female with past medical history significant for GERD, hypertension who presents for evaluation of epigastric pain.  Patient states she has had epigastric pain since 1 PM today with increased beltching.  Patient states she ate pizza from [employer] and developed epigastric pain after eating this as well as 2 fiber one bars.  Patient states she has had previous similar episodes with dairy intake as well as fiberone bar.  Denies radiation of pain to back or shoulders. Patient states she has had 2 episodes of small-volume nonbloody, nonbilious emesis. States she did have a "normal" bowel movement this morning without melena or hematochezia.  No hematemesis. She has been tolerating PO intake at home since pain began without issues. She rates her current pain an 8/10.  Described as a nagging sensation and fullness.  She reports persistent nausea since onset of pain.  She has not take anything for pain PTA.  She denies additional aggravating or alleviating factors. She denies fever, chills, upper respiratory symptoms, chest pain, diaphoresis, lightheadedness, dizziness, cough, shortness of breath, lower abdominal pain, RUQ pain, dysuria, hematuria, diarrhea, constipation, pelvic pain.  She denies previous history of bowel obstructions.  She does have history of laparoscopic cholecystectomy approximately 6 years ago. She denies complications post surgery. Denies NSAID use, alcohol use, hx of pancreatitis. States she does have history of "very small" hiatal hernia however has had increased "fullness and heaviness" sensation in her epigastric region when ever she eat heavy foods such as pasta or bread over the last 2-3 months. No RUQ pain with greasy foods.  Last PO intake  3pm. Prior ABD surgeries- Lap Chole "Many many year ago."  Patient is post menopausal x 6 years.  History obtained from patient. No interpretor was used.   HPI  Past Medical History:  Diagnosis Date  . GERD (gastroesophageal reflux disease)   . Hypertension   . Sleep apnea     Patient Active Problem List   Diagnosis Date Noted  . Chronic rhinitis 06/04/2015    Past Surgical History:  Procedure Laterality Date  . arthritic cyst Left    wrist  . CHOLECYSTECTOMY    . GANGLION CYST EXCISION Left    wrist,  . wisdom teeth extaction       OB History   No obstetric history on file.      Home Medications    Prior to Admission medications   Medication Sig Start Date End Date Taking? Authorizing Provider  Calcium Citrate-Vitamin D (CALCIUM + D PO) Take by mouth.    [provider]  Cyanocobalamin (VITAMIN B-12 PO) Take by mouth.    [provider]  esomeprazole (NEXIUM) 20 MG capsule Take 20 mg by mouth 2 (two) times daily.    [provider]  levocetirizine (XYZAL) 5 MG tablet Take 1 tablet (5 mg total) by mouth every evening. 06/04/15   Leda Roys, MD  losartan (COZAAR) 25 MG tablet Take 25 mg by mouth daily.    [provider]  montelukast (SINGULAIR) 10 MG tablet Take 10 mg by mouth at bedtime.    [provider]  Multiple Vitamins-Minerals (MULTIVITAMIN GUMMIES ADULTS PO) Take by mouth.    [provider]  Olopatadine HCl 0.6 % SOLN Place 2  drops (2 puffs total) into both nostrils 2 (two) times daily. 06/04/15   Leda Roys, MD  ondansetron (ZOFRAN ODT) 4 MG disintegrating tablet Take 1 tablet (4 mg total) by mouth every 8 (eight) hours as needed for nausea or vomiting. 05/31/18   Jearlean Demauro A, PA-C  sucralfate (CARAFATE) 1 g tablet Take 1 tablet (1 g total) by mouth 4 (four) times daily -  with meals and at bedtime for 7 days. 05/31/18 06/07/18  Seeley Southgate A, PA-C    Family History Family History   Problem Relation Age of Onset  . Cancer Mother   . Diabetes Mother   . Heart disease Father   . Cancer Sister   . Heart disease Maternal Grandmother   . Heart disease Maternal Grandfather     Social History Social History   Tobacco Use  . Smoking status: Never Smoker  . Smokeless tobacco: Never Used  Substance Use Topics  . Alcohol use: Yes    Comment: soc  . Drug use: No     Allergies   Patient has no known allergies.   Review of Systems Review of Systems  Constitutional: Negative.   HENT: Negative.   Eyes: Negative.   Respiratory: Negative.   Cardiovascular: Negative.   Gastrointestinal: Positive for abdominal pain, nausea and vomiting. Negative for anal bleeding, blood in stool, constipation, diarrhea and rectal pain.  Endocrine: Negative.   Genitourinary: Negative.   Musculoskeletal: Negative.   Skin: Negative.   Neurological: Negative.   All other systems reviewed and are negative.  Physical Exam Updated Vital Signs BP (!) 144/97 (BP Location: Left Arm)   Pulse 92   Temp 98.6 F (37 C) (Oral)   Resp 20   Ht 5\' 7"  (1.702 m)   Wt 113.4 kg   LMP  (LMP Unknown)   SpO2 94%   BMI 39.16 kg/m   Physical Exam Vitals signs and nursing note reviewed.  Constitutional:      General: She is not in acute distress.    Appearance: She is well-developed. She is obese. She is not ill-appearing, toxic-appearing or diaphoretic.  HENT:     Head: Normocephalic and atraumatic.     Jaw: There is normal jaw occlusion.     Nose: Nose normal.     Right Sinus: No maxillary sinus tenderness or frontal sinus tenderness.     Left Sinus: No maxillary sinus tenderness or frontal sinus tenderness.     Mouth/Throat:     Lips: Pink.     Mouth: Mucous membranes are moist.     Pharynx: Oropharynx is clear. Uvula midline.     Tonsils: No tonsillar exudate or tonsillar abscesses.     Comments: Posterior oropharynx clear. Mucous membranes moist. Uvula midline without deviation. No  oral edema. Eyes:     Pupils: Pupils are equal, round, and reactive to light.  Neck:     Musculoskeletal: Full passive range of motion without pain and normal range of motion.     Trachea: Trachea and phonation normal.     Comments: No neck stiffness or neck rigidity.  Cardiovascular:     Rate and Rhythm: Normal rate.     Pulses: Normal pulses.     Heart sounds: Normal heart sounds. No murmur. No friction rub. No gallop.   Pulmonary:     Effort: Pulmonary effort is normal. No respiratory distress.     Breath sounds: Normal breath sounds and air entry.     Comments: Clear to auscultation bilaterally  without wheeze, rhonchi or rales. No assessory muscle usage. Speaks in full sentences without difficulty. Chest:     Comments: No chest wall TTP. No chest wall skin changes. Abdominal:     General: Bowel sounds are normal. There is no distension. There are no signs of injury.     Palpations: Abdomen is soft. There is no shifting dullness, fluid wave, hepatomegaly, splenomegaly or mass.     Tenderness: There is abdominal tenderness in the epigastric area. There is guarding. There is no right CVA tenderness, left CVA tenderness or rebound. Negative signs include Murphy's sign.     Hernia: No hernia is present.     Comments: Soft without rebound. Epigastric tenderness to palpation with mild guarding. Old well healed scars to abd wall. No evidence of abd wall skin changes. Small ventral hernia able to be reduced without difficulty. Hernia non tender to palpation, no evidence of strangulation or incarcerated hernia. Negative Murphy sign. Normoactive bowel sounds.  Musculoskeletal: Normal range of motion.     Comments: Moves all 4 extremities without difficulty. No lower extremity edema, erythema, echmosis or warmth.Non tender to bilateral calves. 2+ PT pulses bilaterally.  Lymphadenopathy:     Cervical: No cervical adenopathy.  Skin:    General: Skin is warm and dry.     Comments: Brisk cap refill.  No rashes or lesions.  Neurological:     General: No focal deficit present.     Mental Status: She is alert.     Gait: Gait is intact.     Comments: Ambulatory in ED without difficulty. CN 2-12 grossly intact. No facial droop. No dysphagia.    ED Treatments / Results  Labs (all labs ordered are listed, but only abnormal results are displayed) Labs Reviewed  URINALYSIS, ROUTINE W REFLEX MICROSCOPIC - Abnormal; Notable for the following components:      Result Value   Specific Gravity, Urine >1.030 (*)    Ketones, ur 15 (*)    All other components within normal limits  CBC WITH DIFFERENTIAL/PLATELET - Abnormal; Notable for the following components:   Hemoglobin 11.9 (*)    Neutro Abs 8.0 (*)    All other components within normal limits  COMPREHENSIVE METABOLIC PANEL - Abnormal; Notable for the following components:   Glucose, Bld 127 (*)    All other components within normal limits  LIPASE, BLOOD    EKG EKG Interpretation  Date/Time:  Monday May 31 2018 18:02:35 EDT Ventricular Rate:  85 PR Interval:    QRS Duration: 86 QT Interval:  372 QTC Calculation: 443 R Axis:   37 Text Interpretation:  Sinus rhythm Abnormal R-wave progression, early transition Baseline wander in lead(s) V6 No significant change since last tracing Confirmed by Theotis Burrow [phone]) on 05/31/2018 6:04:24 PM   Radiology Ct Abdomen Pelvis W Contrast  Result Date: 05/31/2018 CLINICAL DATA:  Abdominal pain for 4 hours since eating. EXAM: CT ABDOMEN AND PELVIS WITH CONTRAST TECHNIQUE: Multidetector CT imaging of the abdomen and pelvis was performed using the standard protocol following bolus administration of intravenous contrast. CONTRAST:  100 mL OMNIPAQUE IOHEXOL 300 MG/ML  SOLN COMPARISON:  CT abdomen and pelvis 11/26/2017. MRI abdomen 06/02/2009. FINDINGS: Lower chest: There is dependent atelectasis. No pleural or pericardial effusion. Hepatobiliary: Status post cholecystectomy. Hepatomegaly is again  seen. Hemangioma in the right hepatic lobe measuring 5 x 6 cm is unchanged. Pancreas: Unremarkable. No pancreatic ductal dilatation or surrounding inflammatory changes. Spleen: Normal in size without focal abnormality. Adrenals/Urinary Tract: Adrenal glands  are unremarkable. Kidneys are normal, without renal calculi, focal lesion, or hydronephrosis. Bladder is unremarkable. Stomach/Bowel: Stomach is within normal limits. Appendix appears normal. No evidence of bowel wall thickening, distention, or inflammatory changes. Vascular/Lymphatic: No significant vascular findings are present. No enlarged abdominal or pelvic lymph nodes. Reproductive: Uterus and bilateral adnexa are unremarkable. Other: Moderate hiatal hernia containing approximately 50% the stomach is unchanged. Fat containing midline ventral hernia is also unchanged. Musculoskeletal: No acute or focal abnormality. IMPRESSION: No acute abnormality abdomen or pelvis. Hepatomegaly. 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 Moderate hiatal hernia and fat containing ventral hernia are unchanged. Electronically Signed   By: Inge Rise M.D.   On: 05/31/2018 19:52    Procedures Procedures (including critical care time)  Medications Ordered in ED Medications  alum & mag hydroxide-simeth (MAALOX/MYLANTA) 200-200-20 MG/5ML suspension 30 mL (30 mLs Oral Given 05/31/18 1804)    And  lidocaine (XYLOCAINE) 2 % viscous mouth solution 15 mL (15 mLs Oral Given 05/31/18 1804)  ondansetron (ZOFRAN) injection 4 mg (4 mg Intravenous Given 05/31/18 1826)  sodium chloride 0.9 % bolus 1,000 mL (0 mLs Intravenous Stopped 05/31/18 1924)  morphine 4 MG/ML injection 4 mg (4 mg Intravenous Given 05/31/18 1840)  iohexol (OMNIPAQUE) 300 MG/ML solution 100 mL (100 mLs Intravenous Contrast Given 05/31/18 1926)  simethicone (MYLICON) 40 IZ/1.2WP suspension 40 mg (40 mg Oral Given 05/31/18 2019)  ondansetron (ZOFRAN) injection 4 mg (4 mg Intravenous Given 05/31/18  2033)   Initial Impression / Assessment and Plan / ED Course  I have reviewed the triage vital signs and the nursing notes.  Pertinent labs & imaging results that were available during my care of the patient were reviewed by me and considered in my medical decision making (see chart for details).  53 year old female who appears otherwise well presents for evaluation of epigastric pain. Afebrile, non septic, non ill appearing. Epigastric pain after eating 2 fiberone bars and pizza this afternoon. Hx of reflux and similar symptoms with dairy intake and fiberbars. 2 episodes of small amount of NBNB emesis. No CP, SOB, radiation of pain. Heart clear, lungs without adventitious sounds. No tachycardia, tachypnea or hypoxia. Low suspicion for atypical ACS, PE, dissection, esophageal rupture as cause of pain. Abd tender to epigastric region with mild guarding. No rebound or peritoneal signs. Non surgical abd. Hx lap chole without ocmplications. Negative murphy sign. Will obtain labs, EKG, urine, GI cocktail and reevaluate.   1845: Patient with emesis immediately after GI cocktail. Will relief with Zofran and Morphine. Persistent epigastric pain however no longer with guarding. Given persistent pain, shared decision making to obtain CT scan was discussed. Patient would like to proceed with imaging at this time. Do not feel [place] is needed given post cholecystectomy and negative LFTs indicating duct obstruction  1935: On reevaluation abd soft without rebound or guarding. No further episodes of emesis. CT pending.  2010: CT without acute AP pathology. Patient able to tolerate PO intake without difficulty. Likely reflux as cause of her epigastric discomfort with known hiatal hernia however will also cover for possible ulcer with Carafate. Already on Pepcid at home. Will dc home with referral to gen surg for evaluation of her hiatal hernia and ventral hernia. No evidence of bowel strangulation/incarceration or  perforated ulcer. Mild epigastric pain described as fullness and belching. Most likely from hiatal hernia. I have low suspicion for retained gallstone stone from previous cholecystectomy as negative LFTs and no findings on CT scan.  Labs and imaging personally reviewed: CBC: Without leukocytosis, Hgb 11.9 at baseline CMP: with mild hyperglycemia, without additional electrolyte, renal or liver abnormalities. Lipase: 31 Urinalysis: without evidence of infection  CT AP: No acute abnormality abdomen or pelvis. Hepatomegaly. 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 Moderate hiatal hernia and fat containing ventral hernia are unchanged.   Patient is nontoxic, nonseptic appearing, in no apparent distress.  Patient's pain and other symptoms adequately managed in emergency department.  Fluid bolus given.  Labs, imaging and vitals reviewed.  Patient does not meet the SIRS or Sepsis criteria.  On repeat exam patient does not have a surgical abdomin and there are no peritoneal signs.  No indication of appendicitis, bowel obstruction, bowel perforation, cholecystitis, diverticulitis. Patient is hemodynamically stable and in no acute distress.  Patient able to ambulate in department prior to ED and tolerating PO without difficulty for further emesis.  Evaluation does not show acute pathology that would require ongoing or additional emergent interventions while in the emergency department or further inpatient treatment.  I have discussed the diagnosis with the patient and answered all questions.  Patient is comfortable with plan discussed in room and is stable for discharge at this time.  I have discussed strict return precautions for returning to the emergency department.  Patient was encouraged to follow-up with PCP/specialist referred to at discharge.  Final Clinical Impressions(s) / ED Diagnoses   Final diagnoses:  Epigastric pain  Non-intractable vomiting with nausea, unspecified vomiting  type    ED Discharge Orders         Ordered    sucralfate (CARAFATE) 1 g tablet  3 times daily with meals & bedtime,   Status:  Discontinued     05/31/18 2009    ondansetron (ZOFRAN ODT) 4 MG disintegrating tablet  Every 8 hours PRN,   Status:  Discontinued     05/31/18 2009    ondansetron (ZOFRAN ODT) 4 MG disintegrating tablet  Every 8 hours PRN     05/31/18 2012    sucralfate (CARAFATE) 1 g tablet  3 times daily with meals & bedtime     05/31/18 2012           Jefferie Holston A, PA-C 05/31/18 2056    Little, Wenda Overland, MD 05/31/18 2102

## 2018-06-01 ENCOUNTER — Emergency Department (HOSPITAL_BASED_OUTPATIENT_CLINIC_OR_DEPARTMENT_OTHER): Payer: POS

## 2018-06-01 ENCOUNTER — Inpatient Hospital Stay (HOSPITAL_COMMUNITY): Payer: POS

## 2018-06-01 ENCOUNTER — Other Ambulatory Visit: Payer: Self-pay

## 2018-06-01 ENCOUNTER — Encounter (HOSPITAL_BASED_OUTPATIENT_CLINIC_OR_DEPARTMENT_OTHER): Payer: Self-pay | Admitting: *Deleted

## 2018-06-01 ENCOUNTER — Inpatient Hospital Stay (HOSPITAL_BASED_OUTPATIENT_CLINIC_OR_DEPARTMENT_OTHER)
Admission: EM | Admit: 2018-06-01 | Discharge: 2018-06-04 | DRG: 392 | Disposition: A | Discharge status: Home / Self Care | Payer: POS | Attending: Physician Assistant | Admitting: Physician Assistant

## 2018-06-01 DIAGNOSIS — K08409 Partial loss of teeth, unspecified cause, unspecified class: Secondary | ICD-10-CM | POA: Diagnosis present

## 2018-06-01 DIAGNOSIS — K219 Gastro-esophageal reflux disease without esophagitis: Secondary | ICD-10-CM | POA: Diagnosis present

## 2018-06-01 DIAGNOSIS — K440 Diaphragmatic hernia with obstruction, without gangrene: Principal | ICD-10-CM | POA: Diagnosis present

## 2018-06-01 DIAGNOSIS — Z9049 Acquired absence of other specified parts of digestive tract: Secondary | ICD-10-CM | POA: Diagnosis not present

## 2018-06-01 DIAGNOSIS — Z6841 Body Mass Index (BMI) 40.0 and over, adult: Secondary | ICD-10-CM | POA: Diagnosis not present

## 2018-06-01 DIAGNOSIS — Z79899 Other long term (current) drug therapy: Secondary | ICD-10-CM

## 2018-06-01 DIAGNOSIS — Z20828 Contact with and (suspected) exposure to other viral communicable diseases: Secondary | ICD-10-CM | POA: Diagnosis present

## 2018-06-01 DIAGNOSIS — I10 Essential (primary) hypertension: Secondary | ICD-10-CM | POA: Diagnosis present

## 2018-06-01 DIAGNOSIS — G4730 Sleep apnea, unspecified: Secondary | ICD-10-CM | POA: Diagnosis present

## 2018-06-01 DIAGNOSIS — K449 Diaphragmatic hernia without obstruction or gangrene: Secondary | ICD-10-CM

## 2018-06-01 DIAGNOSIS — Z8249 Family history of ischemic heart disease and other diseases of the circulatory system: Secondary | ICD-10-CM

## 2018-06-01 LAB — COMPREHENSIVE METABOLIC PANEL
ALT: 22 U/L (ref 0–44)
AST: 24 U/L (ref 15–41)
Albumin: 4 g/dL (ref 3.5–5.0)
Alkaline Phosphatase: 73 U/L (ref 38–126)
Anion gap: 10 (ref 5–15)
BUN: 14 mg/dL (ref 6–20)
CO2: 20 mmol/L — ABNORMAL LOW (ref 22–32)
Calcium: 8.7 mg/dL — ABNORMAL LOW (ref 8.9–10.3)
Chloride: 107 mmol/L (ref 98–111)
Creatinine, Ser: 0.56 mg/dL (ref 0.44–1.00)
GFR calc Af Amer: >60 mL/min (ref >60)
GFR calc non Af Amer: >60 mL/min (ref >60)
Glucose, Bld: 147 mg/dL — ABNORMAL HIGH (ref 70–99)
Potassium: 4.3 mmol/L (ref 3.5–5.1)
Sodium: 137 mmol/L (ref 135–145)
Total Bilirubin: 0.8 mg/dL (ref 0.3–1.2)
Total Protein: 7.1 g/dL (ref 6.5–8.1)

## 2018-06-01 LAB — CBC WITH DIFFERENTIAL/PLATELET
Abs Immature Granulocytes: 0.07 10*3/uL (ref 0.00–0.07)
Basophils Absolute: 0 10*3/uL (ref 0.0–0.1)
Basophils Relative: 0 %
Eosinophils Absolute: 0 10*3/uL (ref 0.0–0.5)
Eosinophils Relative: 0 %
HCT: 39.4 % (ref 36.0–46.0)
Hemoglobin: 12.4 g/dL (ref 12.0–15.0)
Immature Granulocytes: 1 %
Lymphocytes Relative: 6 %
Lymphs Abs: 0.8 10*3/uL (ref 0.7–4.0)
MCH: 27.3 pg (ref 26.0–34.0)
MCHC: 31.5 g/dL (ref 30.0–36.0)
MCV: 86.6 fL (ref 80.0–100.0)
Monocytes Absolute: 0.7 10*3/uL (ref 0.1–1.0)
Monocytes Relative: 6 %
Neutro Abs: 10.9 10*3/uL — ABNORMAL HIGH (ref 1.7–7.7)
Neutrophils Relative %: 87 %
Platelets: 285 10*3/uL (ref 150–400)
RBC: 4.55 MIL/uL (ref 3.87–5.11)
RDW: 13.5 % (ref 11.5–15.5)
WBC: 12.7 10*3/uL — ABNORMAL HIGH (ref 4.0–10.5)
nRBC: 0 % (ref 0.0–0.2)

## 2018-06-01 LAB — LIPASE, BLOOD: Lipase: 28 U/L (ref 11–51)

## 2018-06-01 LAB — SARS CORONAVIRUS 2 AG (30 MIN TAT): SARS Coronavirus 2 Ag: NEGATIVE

## 2018-06-01 LAB — LACTIC ACID, PLASMA: Lactic Acid, Venous: 0.9 mmol/L (ref 0.5–1.9)

## 2018-06-01 LAB — TROPONIN I: Troponin I: <0.03 ng/mL (ref <0.03)

## 2018-06-01 MED ORDER — METOCLOPRAMIDE HCL 5 MG/ML IJ SOLN
10.0000 mg | Freq: Once | INTRAMUSCULAR | Status: AC
  Administered 2018-06-01: 10 mg via INTRAVENOUS
  Filled 2018-06-01: qty 2

## 2018-06-01 MED ORDER — ONDANSETRON 4 MG PO TBDP: 4.0000 mg | ORAL_TABLET | Freq: Four times a day (QID) | ORAL | Status: DC | PRN

## 2018-06-01 MED ORDER — SODIUM CHLORIDE 0.9 % IV BOLUS
500.0000 mL | Freq: Once | INTRAVENOUS | Status: AC
  Administered 2018-06-01: 16:00:00 500 mL via INTRAVENOUS

## 2018-06-01 MED ORDER — PANTOPRAZOLE SODIUM 40 MG IV SOLR
40.0000 mg | Freq: Every day | INTRAVENOUS | Status: DC
  Administered 2018-06-01 – 2018-06-03 (×3): 40 mg via INTRAVENOUS
  Filled 2018-06-01 (×3): qty 40

## 2018-06-01 MED ORDER — KCL IN DEXTROSE-NACL 20-5-0.45 MEQ/L-%-% IV SOLN
INTRAVENOUS | Status: DC
  Administered 2018-06-01 – 2018-06-04 (×6): via INTRAVENOUS
  Filled 2018-06-01 (×6): qty 1000

## 2018-06-01 MED ORDER — MORPHINE SULFATE (PF) 4 MG/ML IV SOLN
4.0000 mg | Freq: Once | INTRAVENOUS | Status: AC
  Administered 2018-06-01: 4 mg via INTRAVENOUS
  Filled 2018-06-01: qty 1

## 2018-06-01 MED ORDER — ONDANSETRON HCL 4 MG/2ML IJ SOLN
4.0000 mg | Freq: Once | INTRAMUSCULAR | Status: AC
  Administered 2018-06-01: 4 mg via INTRAVENOUS
  Filled 2018-06-01: qty 2

## 2018-06-01 MED ORDER — DICYCLOMINE HCL 10 MG/ML IM SOLN
20.0000 mg | Freq: Once | INTRAMUSCULAR | Status: AC
  Administered 2018-06-01: 20 mg via INTRAMUSCULAR
  Filled 2018-06-01: qty 2

## 2018-06-01 MED ORDER — HYDROMORPHONE HCL 1 MG/ML IJ SOLN
1.0000 mg | Freq: Once | INTRAMUSCULAR | Status: AC
  Administered 2018-06-01: 1 mg via INTRAVENOUS
  Filled 2018-06-01: qty 1

## 2018-06-01 MED ORDER — ENOXAPARIN SODIUM 40 MG/0.4ML ~~LOC~~ SOLN
40.0000 mg | Freq: Every day | SUBCUTANEOUS | Status: DC
  Administered 2018-06-01 – 2018-06-03 (×3): 40 mg via SUBCUTANEOUS
  Filled 2018-06-01 (×3): qty 0.4

## 2018-06-01 MED ORDER — FAMOTIDINE IN NACL 20-0.9 MG/50ML-% IV SOLN
20.0000 mg | Freq: Once | INTRAVENOUS | Status: AC
  Administered 2018-06-01: 20 mg via INTRAVENOUS
  Filled 2018-06-01: qty 50

## 2018-06-01 MED ORDER — MORPHINE SULFATE (PF) 2 MG/ML IV SOLN
1.0000 mg | INTRAVENOUS | Status: DC | PRN
  Administered 2018-06-02 (×4): 2 mg via INTRAVENOUS
  Filled 2018-06-01 (×4): qty 1

## 2018-06-01 MED ORDER — ONDANSETRON HCL 4 MG/2ML IJ SOLN
4.0000 mg | Freq: Four times a day (QID) | INTRAMUSCULAR | Status: DC | PRN
  Administered 2018-06-01 – 2018-06-03 (×2): 4 mg via INTRAVENOUS
  Filled 2018-06-01 (×2): qty 2

## 2018-06-01 MED ORDER — ALUM & MAG HYDROXIDE-SIMETH 200-200-20 MG/5ML PO SUSP
30.0000 mL | Freq: Once | ORAL | Status: AC
  Administered 2018-06-01: 17:00:00 30 mL via ORAL
  Filled 2018-06-01: qty 30

## 2018-06-01 MED ORDER — SODIUM CHLORIDE 0.9 % IV BOLUS
1000.0000 mL | Freq: Once | INTRAVENOUS | Status: AC
  Administered 2018-06-01: 1000 mL via INTRAVENOUS

## 2018-06-01 NOTE — ED Notes (Signed)
Update provided to husband mike.

## 2018-06-01 NOTE — ED Notes (Signed)
Pt 95% on RA at time of transfer

## 2018-06-01 NOTE — ED Notes (Signed)
ED TO INPATIENT HANDOFF REPORT  ED Nurse Name and Phone #:  Duard Larsen RN [phone]  S Name/Age/Gender Theresa Gilbert 53 y.o. female Room/Bed: WA23/WA23  Code Status   Code Status: Not on file  Home/SNF/Other Home Patient oriented to: self, place, time and situation Is this baseline? Yes   Triage Complete: Triage complete  Chief Complaint Chest, Abdominal Pain, Vomiting (sent by Dr.)  Triage Note Pt c/o abd pain x 3 days , seen here x 1 day ago for same and seen  by PMD today for same    Allergies No Known Allergies  Level of Care/Admitting Diagnosis ED Disposition    ED Disposition Condition [place]: [place] [[id number]]  Level of Care: Med-Surg [16]  Covid Evaluation: N/A  Diagnosis: Hiatal hernia with obstruction but no gangrene [[id number]]  Admitting Physician: [place], [place]  Attending Physician: CCS, MD [[id number]]  Estimated length of stay: 3 - 4 days  Certification:: I certify this patient will need inpatient services for at least 2 midnights  Bed request comments: 5 west  PT Class (Do Not Modify): Inpatient [101]  PT Acc Code (Do Not Modify): Private [1]       B Medical/Surgery History Past Medical History:  Diagnosis Date  . GERD (gastroesophageal reflux disease)   . Hypertension   . Sleep apnea    Past Surgical History:  Procedure Laterality Date  . arthritic cyst Left    wrist  . CHOLECYSTECTOMY    . GANGLION CYST EXCISION Left    wrist,  . wisdom teeth extaction       A IV Location/Drains/Wounds Patient Lines/Drains/Airways Status   Active Line/Drains/Airways    Name:   Placement date:   Placement time:   Site:   Days:   Peripheral IV 06/01/18 Right Wrist   06/01/18    1610    Wrist   less than 1   Peripheral IV 06/01/18 Left Antecubital   06/01/18    1844    Antecubital   less than 1   NG/OG Tube Nasogastric 16 Fr. Left nare Xray   06/01/18    2210    Left nare   less than 1           Intake/Output Last 24 hours  Intake/Output Summary (Last 24 hours) at 06/01/2018 2228 Last data filed at 06/01/2018 2226 Gross per 24 hour  Intake 1550 ml  Output 150 ml  Net 1400 ml    Labs/Imaging Results for orders placed or performed during the hospital encounter of 06/01/18 (from the past 48 hour(s))  CBC with Differential     Status: Abnormal   Collection Time: 06/01/18  3:56 PM  Result Value Ref Range   WBC 12.7 (H) 4.0 - 10.5 K/uL   RBC 4.55 3.87 - 5.11 MIL/uL   Hemoglobin 12.4 12.0 - 15.0 g/dL   HCT 39.4 36.0 - 46.0 %   MCV 86.6 80.0 - 100.0 fL   MCH 27.3 26.0 - 34.0 pg   MCHC 31.5 30.0 - 36.0 g/dL   RDW 13.5 11.5 - 15.5 %   Platelets 285 150 - 400 K/uL   nRBC 0.0 0.0 - 0.2 %   Neutrophils Relative % 87 %   Neutro Abs 10.9 (H) 1.7 - 7.7 K/uL   Lymphocytes Relative 6 %   Lymphs Abs 0.8 0.7 - 4.0 K/uL   Monocytes Relative 6 %   Monocytes Absolute 0.7 0.1 - 1.0 K/uL  Eosinophils Relative 0 %   Eosinophils Absolute 0.0 0.0 - 0.5 K/uL   Basophils Relative 0 %   Basophils Absolute 0.0 0.0 - 0.1 K/uL   Immature Granulocytes 1 %   Abs Immature Granulocytes 0.07 0.00 - 0.07 K/uL    Comment: Performed at [facility], [address] [place], [place] [address]  Troponin I - ONCE - STAT     Status: None   Collection Time: 06/01/18  5:15 PM  Result Value Ref Range   Troponin I <0.03 <0.03 ng/mL    Comment: Performed at [facility], [address] [place], [place] [address]  Comprehensive metabolic panel     Status: Abnormal   Collection Time: 06/01/18  5:15 PM  Result Value Ref Range   Sodium 137 135 - 145 mmol/L   Potassium 4.3 3.5 - 5.1 mmol/L   Chloride 107 98 - 111 mmol/L   CO2 20 (L) 22 - 32 mmol/L   Glucose, Bld 147 (H) 70 - 99 mg/dL   BUN 14 6 - 20 mg/dL   Creatinine, Ser 0.56 0.44 - 1.00 mg/dL   Calcium 8.7 (L) 8.9 - 10.3 mg/dL   Total Protein 7.1 6.5 - 8.1 g/dL   Albumin 4.0 3.5 - 5.0 g/dL   AST 24 15 - 41 U/L   ALT 22 0 - 44  U/L   Alkaline Phosphatase 73 38 - 126 U/L   Total Bilirubin 0.8 0.3 - 1.2 mg/dL   GFR calc non Af Amer >60 >60 mL/min   GFR calc Af Amer >60 >60 mL/min   Anion gap 10 5 - 15    Comment: Performed at [facility], [address] [place], [place] [address]  Lipase, blood     Status: None   Collection Time: 06/01/18  5:15 PM  Result Value Ref Range   Lipase 28 11 - 51 U/L    Comment: Performed at [facility], [address] [place], [place] [address]  Lactic acid, plasma     Status: None   Collection Time: 06/01/18  6:04 PM  Result Value Ref Range   Lactic Acid, Venous 0.9 0.5 - 1.9 mmol/L    Comment: Performed at [facility], [address] [place], [place] [address]  SARS Coronavirus 2 (Hosp order,Performed in [place] lab via [employer] ID)     Status: None   Collection Time: 06/01/18  6:11 PM  Result Value Ref Range   SARS Coronavirus 2 (Abbott ID Now) NEGATIVE NEGATIVE    Comment: (NOTE) Interpretive Result Comment(s): COVID 19 Positive SARS CoV 2 target nucleic acids are DETECTED. The SARS CoV 2 RNA is generally detectable in upper and lower respiratory specimens during the acute phase of infection.  Positive results are indicative of active infection with SARS CoV 2.  Clinical correlation with patient history and other diagnostic information is necessary to determine patient infection status.  Positive results do not rule out bacterial infection or coinfection with other viruses. The expected result is Negative. COVID 19 Negative SARS CoV 2 target nucleic acids are NOT DETECTED. The SARS CoV 2 RNA is generally detectable in upper and lower respiratory specimens during the acute phase of infection.  Negative results do not preclude SARS CoV 2 infection, do not rule out coinfections with other pathogens, and should not be used as the sole basis for treatment or other patient management decisions.  Negative results must  be combined with  clinical  observations, patient history, and epidemiological information. The expected result is Negative. Invalid Presence or absence of SARS CoV 2 nucleic acids cannot be determined. Repeat testing was performed on the submitted specimen and repeated Invalid results were obtained.  If clinically indicated, additional testing on a new specimen with an alternate test methodology [id number]) is advised.  The SARS CoV 2 RNA is generally detectable in upper and lower respiratory specimens during the acute phase of infection. The expected result is Negative. Fact Sheet for Patients:  GolfingFamily.no Fact Sheet for Healthcare Providers: https://www.hernandez-brewer.com/ This test is not yet approved or cleared by the [place] FDA and has been authorized for detection and/or diagnosis of SARS CoV 2 by FDA under an Emergency Use Authorization (EUA).  This EUA will remain in effect (meaning this test can be used) for the duration of the COVID19 d eclaration under Section 564(b)(1) of the Act, 21 U.S.C. section 760 029 8771 3(b)(1), unless the authorization is terminated or revoked sooner. Performed at [facility], [address], [place], [place] [address]    Dg Chest 2 View  Result Date: 06/01/2018 CLINICAL DATA:  Chest pain for 16 hours. EXAM: CHEST - 2 VIEW COMPARISON:  09/16/2015 FINDINGS: Cardiomediastinal silhouette is unremarkable. A moderate hiatal hernia is now distended with fluid and gas. The stomach appears mildly distended as well. Mild LEFT basilar atelectasis noted. No pleural effusion, pneumothorax, definite airspace disease or acute bony abnormality. IMPRESSION: Moderate hiatal hernia, now distended with fluid and gas and suggestion of gaseous distension is well. This may represent gastric or bowel obstruction. Consider abdominal/pelvic CT as clinically indicated. Mild LEFT basilar atelectasis. Electronically Signed   By: Margarette Canada  M.D.   On: 06/01/2018 16:58   Ct Abdomen Pelvis W Contrast  Addendum Date: 06/01/2018   ADDENDUM REPORT: 06/01/2018 18:09 ADDENDUM: The patient return to the emergency department today and I discussed this case with the clinician caring for the patient. I was asked to review this examination as chest films today demonstrate increased gaseous distention of the stomach both above and below the diaphragm. Based on review, the patient has developed a paraesophageal hernia since the prior CT abdomen and pelvis. There are air-fluid levels both above and below the diaphragm. The gastroesophageal junction appears to be near the diaphragmatic hiatus. The duodenum is below the diaphragm. Mild to moderate gaseous distension components of the stomach above and below the diaphragm are worrisome for volvulus, possibly intermittent. There are no CT signs of ischemia or inflammation. Electronically Signed   By: Inge Rise M.D.   On: 06/01/2018 18:09   Result Date: 06/01/2018 CLINICAL DATA:  Abdominal pain for 4 hours since eating. EXAM: CT ABDOMEN AND PELVIS WITH CONTRAST TECHNIQUE: Multidetector CT imaging of the abdomen and pelvis was performed using the standard protocol following bolus administration of intravenous contrast. CONTRAST:  100 mL OMNIPAQUE IOHEXOL 300 MG/ML  SOLN COMPARISON:  CT abdomen and pelvis 11/26/2017. MRI abdomen 06/02/2009. FINDINGS: Lower chest: There is dependent atelectasis. No pleural or pericardial effusion. Hepatobiliary: Status post cholecystectomy. Hepatomegaly is again seen. Hemangioma in the right hepatic lobe measuring 5 x 6 cm is unchanged. Pancreas: Unremarkable. No pancreatic ductal dilatation or surrounding inflammatory changes. Spleen: Normal in size without focal abnormality. Adrenals/Urinary Tract: Adrenal glands are unremarkable. Kidneys are normal, without renal calculi, focal lesion, or hydronephrosis. Bladder is unremarkable. Stomach/Bowel: Stomach is within normal limits.  Appendix appears normal. No evidence of bowel wall thickening, distention, or  inflammatory changes. Vascular/Lymphatic: No significant vascular findings are present. No enlarged abdominal or pelvic lymph nodes. Reproductive: Uterus and bilateral adnexa are unremarkable. Other: Moderate hiatal hernia containing approximately 50% the stomach is unchanged. Fat containing midline ventral hernia is also unchanged. Musculoskeletal: No acute or focal abnormality. IMPRESSION: No acute abnormality abdomen or pelvis. Hepatomegaly. 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 Moderate hiatal hernia and fat containing ventral hernia are unchanged. Electronically Signed: By: Inge Rise M.D. On: 05/31/2018 19:52    Pending Labs [employer] (From admission, onward)    Start     Ordered   Signed and Held  HIV antibody (Routine Testing)  Once,   R     Signed and Held   Signed and Held  CBC WITH DIFFERENTIAL  Tomorrow morning,   R     Signed and Held   Signed and Held  Comprehensive metabolic panel  Tomorrow morning,   R     Signed and Held          Vitals/Pain Today's Vitals   06/01/18 1930 06/01/18 2104 06/01/18 2115 06/01/18 2200  BP: 133/77  135/89 (!) 157/96  Pulse: 77  (!) 109 (!) 109  Resp: 17  18 (!) 24  Temp:      TempSrc:      SpO2: 93%  91% 98%  Weight:      Height:      PainSc:  3       Isolation Precautions No active isolations  Medications Medications  metoCLOPramide (REGLAN) injection 10 mg (10 mg Intravenous Given 06/01/18 1627)  alum & mag hydroxide-simeth (MAALOX/MYLANTA) 200-200-20 MG/5ML suspension 30 mL (30 mLs Oral Given 06/01/18 1630)  famotidine (PEPCID) IVPB 20 mg premix (0 mg Intravenous Stopped 06/01/18 1810)  sodium chloride 0.9 % bolus 500 mL (0 mLs Intravenous Stopped 06/01/18 1810)  dicyclomine (BENTYL) injection 20 mg (20 mg Intramuscular Given 06/01/18 1630)  morphine 4 MG/ML injection 4 mg (4 mg Intravenous Given 06/01/18 1800)  sodium  chloride 0.9 % bolus 1,000 mL (0 mLs Intravenous Stopped 06/01/18 1948)  HYDROmorphone (DILAUDID) injection 1 mg (1 mg Intravenous Given 06/01/18 1845)  ondansetron (ZOFRAN) injection 4 mg (4 mg Intravenous Given 06/01/18 2109)  morphine 4 MG/ML injection 4 mg (4 mg Intravenous Given 06/01/18 2111)    Mobility walks Low fall risk   Focused Assessments Gastro   R Recommendations: See Admitting Provider Note  Report given to:   Additional Notes:  N/A

## 2018-06-01 NOTE — ED Notes (Signed)
Report given to [employer], Agricultural consultant

## 2018-06-01 NOTE — ED Notes (Signed)
Pt on monitor 

## 2018-06-01 NOTE — ED Notes (Signed)
[facility] arrived to transport pt to [place] made aware unable to give report at this time to floor. Will try again.

## 2018-06-01 NOTE — ED Notes (Signed)
ED TO INPATIENT HANDOFF REPORT  ED Nurse Name and Phone #: Shelda Pal Crockett Name/Age/Gender Theresa Gilbert 53 y.o. female Room/Bed: MH05/MH05  Code Status   Code Status: Not on file  Home/SNF/Other Home Patient oriented to: self, place, time and situation Is this baseline? Yes   Triage Complete: Triage complete  Chief Complaint Chest, Abdominal Pain, Vomiting (sent by Dr.)  Triage Note Pt c/o abd pain x 3 days , seen here x 1 day ago for same and seen  by PMD today for same    Allergies No Known Allergies  Level of Care/Admitting Diagnosis ED Disposition    None      B Medical/Surgery History Past Medical History:  Diagnosis Date  . GERD (gastroesophageal reflux disease)   . Hypertension   . Sleep apnea    Past Surgical History:  Procedure Laterality Date  . arthritic cyst Left    wrist  . CHOLECYSTECTOMY    . GANGLION CYST EXCISION Left    wrist,  . wisdom teeth extaction       A IV Location/Drains/Wounds Patient Lines/Drains/Airways Status   Active Line/Drains/Airways    Name:   Placement date:   Placement time:   Site:   Days:   Peripheral IV 06/01/18 Right Wrist   06/01/18    1610    Wrist   less than 1   Peripheral IV 06/01/18 Left Antecubital   06/01/18    1844    Antecubital   less than 1          Intake/Output Last 24 hours  Intake/Output Summary (Last 24 hours) at 06/01/2018 1955 Last data filed at 06/01/2018 1948 Gross per 24 hour  Intake 1550 ml  Output -  Net 1550 ml    Labs/Imaging Results for orders placed or performed during the hospital encounter of 06/01/18 (from the past 48 hour(s))  CBC with Differential     Status: Abnormal   Collection Time: 06/01/18  3:56 PM  Result Value Ref Range   WBC 12.7 (H) 4.0 - 10.5 K/uL   RBC 4.55 3.87 - 5.11 MIL/uL   Hemoglobin 12.4 12.0 - 15.0 g/dL   HCT 39.4 36.0 - 46.0 %   MCV 86.6 80.0 - 100.0 fL   MCH 27.3 26.0 - 34.0 pg   MCHC 31.5 30.0 - 36.0 g/dL   RDW 13.5 11.5 -  15.5 %   Platelets 285 150 - 400 K/uL   nRBC 0.0 0.0 - 0.2 %   Neutrophils Relative % 87 %   Neutro Abs 10.9 (H) 1.7 - 7.7 K/uL   Lymphocytes Relative 6 %   Lymphs Abs 0.8 0.7 - 4.0 K/uL   Monocytes Relative 6 %   Monocytes Absolute 0.7 0.1 - 1.0 K/uL   Eosinophils Relative 0 %   Eosinophils Absolute 0.0 0.0 - 0.5 K/uL   Basophils Relative 0 %   Basophils Absolute 0.0 0.0 - 0.1 K/uL   Immature Granulocytes 1 %   Abs Immature Granulocytes 0.07 0.00 - 0.07 K/uL    Comment: Performed at [facility], [address] [place], [place] [address]  Troponin I - ONCE - STAT     Status: None   Collection Time: 06/01/18  5:15 PM  Result Value Ref Range   Troponin I <0.03 <0.03 ng/mL    Comment: Performed at [facility], [address] [place], [place] [address]  Comprehensive metabolic panel     Status: Abnormal  Collection Time: 06/01/18  5:15 PM  Result Value Ref Range   Sodium 137 135 - 145 mmol/L   Potassium 4.3 3.5 - 5.1 mmol/L   Chloride 107 98 - 111 mmol/L   CO2 20 (L) 22 - 32 mmol/L   Glucose, Bld 147 (H) 70 - 99 mg/dL   BUN 14 6 - 20 mg/dL   Creatinine, Ser 0.56 0.44 - 1.00 mg/dL   Calcium 8.7 (L) 8.9 - 10.3 mg/dL   Total Protein 7.1 6.5 - 8.1 g/dL   Albumin 4.0 3.5 - 5.0 g/dL   AST 24 15 - 41 U/L   ALT 22 0 - 44 U/L   Alkaline Phosphatase 73 38 - 126 U/L   Total Bilirubin 0.8 0.3 - 1.2 mg/dL   GFR calc non Af Amer >60 >60 mL/min   GFR calc Af Amer >60 >60 mL/min   Anion gap 10 5 - 15    Comment: Performed at [facility], [address] [place], [place] [address]  Lipase, blood     Status: None   Collection Time: 06/01/18  5:15 PM  Result Value Ref Range   Lipase 28 11 - 51 U/L    Comment: Performed at [facility], [address] [place], [place] [address]  Lactic acid, plasma     Status: None   Collection Time: 06/01/18  6:04 PM  Result Value Ref Range   Lactic Acid, Venous 0.9 0.5 - 1.9 mmol/L    Comment: Performed  at [facility], [address] [place], [place] [address]  SARS Coronavirus 2 (Hosp order,Performed in [place] lab via [employer] ID)     Status: None   Collection Time: 06/01/18  6:11 PM  Result Value Ref Range   SARS Coronavirus 2 (Abbott ID Now) NEGATIVE NEGATIVE    Comment: (NOTE) Interpretive Result Comment(s): COVID 19 Positive SARS CoV 2 target nucleic acids are DETECTED. The SARS CoV 2 RNA is generally detectable in upper and lower respiratory specimens during the acute phase of infection.  Positive results are indicative of active infection with SARS CoV 2.  Clinical correlation with patient history and other diagnostic information is necessary to determine patient infection status.  Positive results do not rule out bacterial infection or coinfection with other viruses. The expected result is Negative. COVID 19 Negative SARS CoV 2 target nucleic acids are NOT DETECTED. The SARS CoV 2 RNA is generally detectable in upper and lower respiratory specimens during the acute phase of infection.  Negative results do not preclude SARS CoV 2 infection, do not rule out coinfections with other pathogens, and should not be used as the sole basis for treatment or other patient management decisions.  Negative results must be combined with clinical  observations, patient history, and epidemiological information. The expected result is Negative. Invalid Presence or absence of SARS CoV 2 nucleic acids cannot be determined. Repeat testing was performed on the submitted specimen and repeated Invalid results were obtained.  If clinically indicated, additional testing on a new specimen with an alternate test methodology [id number]) is advised.  The SARS CoV 2 RNA is generally detectable in upper and lower respiratory specimens during the acute phase of infection. The expected result is Negative. Fact Sheet for Patients:  GolfingFamily.no Fact Sheet for  Healthcare Providers: https://www.hernandez-brewer.com/ This test is not yet approved or cleared by the [place] FDA and has been authorized for detection and/or diagnosis of SARS CoV 2  by FDA under an Emergency Use Authorization (EUA).  This EUA will remain in effect (meaning this test can be used) for the duration of the COVID19 d eclaration under Section 564(b)(1) of the Act, 21 U.S.C. section 9784919992 3(b)(1), unless the authorization is terminated or revoked sooner. Performed at [facility], [address] [place], [place] [address]    Dg Chest 2 View  Result Date: 06/01/2018 CLINICAL DATA:  Chest pain for 16 hours. EXAM: CHEST - 2 VIEW COMPARISON:  09/16/2015 FINDINGS: Cardiomediastinal silhouette is unremarkable. A moderate hiatal hernia is now distended with fluid and gas. The stomach appears mildly distended as well. Mild LEFT basilar atelectasis noted. No pleural effusion, pneumothorax, definite airspace disease or acute bony abnormality. IMPRESSION: Moderate hiatal hernia, now distended with fluid and gas and suggestion of gaseous distension is well. This may represent gastric or bowel obstruction. Consider abdominal/pelvic CT as clinically indicated. Mild LEFT basilar atelectasis. Electronically Signed   By: Margarette Canada M.D.   On: 06/01/2018 16:58   Ct Abdomen Pelvis W Contrast  Addendum Date: 06/01/2018   ADDENDUM REPORT: 06/01/2018 18:09 ADDENDUM: The patient return to the emergency department today and I discussed this case with the clinician caring for the patient. I was asked to review this examination as chest films today demonstrate increased gaseous distention of the stomach both above and below the diaphragm. Based on review, the patient has developed a paraesophageal hernia since the prior CT abdomen and pelvis. There are air-fluid levels both above and below the diaphragm. The gastroesophageal junction appears to be near the diaphragmatic hiatus. The  duodenum is below the diaphragm. Mild to moderate gaseous distension components of the stomach above and below the diaphragm are worrisome for volvulus, possibly intermittent. There are no CT signs of ischemia or inflammation. Electronically Signed   By: Inge Rise M.D.   On: 06/01/2018 18:09   Result Date: 06/01/2018 CLINICAL DATA:  Abdominal pain for 4 hours since eating. EXAM: CT ABDOMEN AND PELVIS WITH CONTRAST TECHNIQUE: Multidetector CT imaging of the abdomen and pelvis was performed using the standard protocol following bolus administration of intravenous contrast. CONTRAST:  100 mL OMNIPAQUE IOHEXOL 300 MG/ML  SOLN COMPARISON:  CT abdomen and pelvis 11/26/2017. MRI abdomen 06/02/2009. FINDINGS: Lower chest: There is dependent atelectasis. No pleural or pericardial effusion. Hepatobiliary: Status post cholecystectomy. Hepatomegaly is again seen. Hemangioma in the right hepatic lobe measuring 5 x 6 cm is unchanged. Pancreas: Unremarkable. No pancreatic ductal dilatation or surrounding inflammatory changes. Spleen: Normal in size without focal abnormality. Adrenals/Urinary Tract: Adrenal glands are unremarkable. Kidneys are normal, without renal calculi, focal lesion, or hydronephrosis. Bladder is unremarkable. Stomach/Bowel: Stomach is within normal limits. Appendix appears normal. No evidence of bowel wall thickening, distention, or inflammatory changes. Vascular/Lymphatic: No significant vascular findings are present. No enlarged abdominal or pelvic lymph nodes. Reproductive: Uterus and bilateral adnexa are unremarkable. Other: Moderate hiatal hernia containing approximately 50% the stomach is unchanged. Fat containing midline ventral hernia is also unchanged. Musculoskeletal: No acute or focal abnormality. IMPRESSION: No acute abnormality abdomen or pelvis. Hepatomegaly. 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 Moderate hiatal hernia and fat containing ventral hernia are unchanged.  Electronically Signed: By: Inge Rise M.D. On: 05/31/2018 19:52    Pending Labs Unresulted Labs (From admission, onward)    Start     Ordered   06/01/18 1804  Lactic acid, plasma  Now then every 2 hours,   STAT  06/01/18 1803          Vitals/Pain Today's Vitals   06/01/18 1830 06/01/18 1850 06/01/18 1917 06/01/18 1930  BP: (!) 149/97   133/77  Pulse: 98   77  Resp: 20   17  Temp:      TempSrc:      SpO2: 95%   93%  Weight:      Height:      PainSc:  7  2      Isolation Precautions No active isolations  Medications Medications  metoCLOPramide (REGLAN) injection 10 mg (10 mg Intravenous Given 06/01/18 1627)  alum & mag hydroxide-simeth (MAALOX/MYLANTA) 200-200-20 MG/5ML suspension 30 mL (30 mLs Oral Given 06/01/18 1630)  famotidine (PEPCID) IVPB 20 mg premix (0 mg Intravenous Stopped 06/01/18 1810)  sodium chloride 0.9 % bolus 500 mL (0 mLs Intravenous Stopped 06/01/18 1810)  dicyclomine (BENTYL) injection 20 mg (20 mg Intramuscular Given 06/01/18 1630)  morphine 4 MG/ML injection 4 mg (4 mg Intravenous Given 06/01/18 1800)  sodium chloride 0.9 % bolus 1,000 mL (0 mLs Intravenous Stopped 06/01/18 1948)  HYDROmorphone (DILAUDID) injection 1 mg (1 mg Intravenous Given 06/01/18 1845)    Mobility walks Low fall risk   Focused Assessments Cardiac Assessment Handoff:    Lab Results  Component Value Date   TROPONINI <0.03 06/01/2018   No results found for: DDIMER Does the Patient currently have chest pain? No     R Recommendations: See Admitting Provider Note  Report given to:   Additional Notes: pt hard IV stick- 24g in R wrist and 20g placed via ultrasound to [place]. Pt SpO2 drops with narcotic administration.

## 2018-06-01 NOTE — ED Notes (Signed)
[facility] notified ([name]) - patient ready for transport 

## 2018-06-01 NOTE — ED Notes (Signed)
Attempted to call report- unable to get answer to main line

## 2018-06-01 NOTE — ED Provider Notes (Signed)
PT transferred from [facility] to [facility] ED for general surgery eval after CT scan showed hiatal hernia with possible volvulus/obstruction. PT arrived awake, sleepy but protecting airway. Complaining of nausea, pain starting to return. VS stable. Ordered morphine and zofran. Dr. Lucia Gaskins paged and pt will be admitted for further w/u and treatment.   Daymen Hassebrock, Wenda Overland, MD 06/01/18 2139

## 2018-06-01 NOTE — ED Notes (Signed)
Pt placed on nasal cannula for support due to dropping to 88% after med administration

## 2018-06-01 NOTE — ED Notes (Signed)
Placed patient on oxygen via 2L due to saturation dropping to 92%-91% and maintaining there. Oxygen saturation has improved to 96%.

## 2018-06-01 NOTE — H&P (Signed)
Re:   Theresa Gilbert DOB:   01-28-1965 MRN:   [id number]  Chief Complaint Nausea and vomiting  ASSESEMENT AND PLAN: 1.  Hiatal hernia  Apparent intermittent obstruction caused by the [facility].  Plan:  Admission, IVF, try NGT (though this may not help), repeat KUB in AM.  2.  Morbid obesity  BMI approximately 40 3.  HTN x 4 years 4.  Epigastric hernia in old incision 5.  COVID-19 - negative  Chief Complaint  Patient presents with  . Abdominal Pain   PHYSICIAN REQUESTING CONSULTATION:  Theresa Ceo, PA, [facility]  HISTORY OF PRESENT ILLNESS: Theresa Gilbert is a 53 y.o. (DOB: 1965/10/04)  white female whose primary care physician is Theresa Shan, MD.   Patient has had chronic reflux GERD requiring antireflux medicine.  But she has never had severe abdominal pain.  Then yesterday, 5/11, she ate some lemon bars and coffee in the morning and developed what she described as "bad gastric pain" by the afternoon.  She tried taking Pepcid AC, but she thought that she vomited this back up.  She went to [facility] where she was evaluated.  When she left, she did not think she felt much better, but she had been given medicine.  Because of persistent epigastric pain, she talked to her PCP, Theresa Gilbert, who advised that she return to [place].   So she returned to [facility] this evening to be evaluated.  Her evaluation was concerning for a gastric obstruction.  Her only prior abdominal surgery was a lap chole.  She saw Theresa Gilbert in [facility] last fall for her abdominal wall hernia.  He has since left the practice.   CT scan of the abdomen yesterday, 05/31/2018, showed a moderate HH.  But because she came back today, she had a delayed review of the CT scan from yesterday and it is thought that she has a paraesophageal hiatal hernia.  Theresa Gilbert raises the concern of intermittent volvulus.  Note:  She had a CT scan in Nov 2019 which  showed the [facility].  She also has a epigastric hernia seen with fat in the hernia, no bowel.   KUB today shows a moderate hiatal hernia, now distended with fluid and gas and suggestion of gaseous distension is well. This may represent gastric or bowel obstruction.    Past Medical History:  Diagnosis Date  . GERD (gastroesophageal reflux disease)   . Hypertension   . Sleep apnea       Past Surgical History:  Procedure Laterality Date  . arthritic cyst Left    wrist  . CHOLECYSTECTOMY    . GANGLION CYST EXCISION Left    wrist,  . wisdom teeth extaction        Current Facility-Administered Medications  Medication Dose Route Frequency Provider Last Rate Last Dose  . morphine 4 MG/ML injection 4 mg  4 mg Intravenous Once Theresa Gilbert, Theresa Overland, MD      . ondansetron [facility]) injection 4 mg  4 mg Intravenous Once Theresa Gilbert, Theresa Overland, MD       Current Outpatient Medications  Medication Sig Dispense Refill  . Calcium Citrate-Vitamin D (CALCIUM + D PO) Take by mouth.    . Cyanocobalamin (VITAMIN B-12 PO) Take by mouth.    . esomeprazole (NEXIUM) 20 MG capsule Take 20 mg by mouth 2 (two) times daily.    Marland Kitchen levocetirizine (XYZAL) 5 MG tablet Take 1  tablet (5 mg total) by mouth every evening. 30 tablet 0  . losartan (COZAAR) 25 MG tablet Take 25 mg by mouth daily.    . montelukast (SINGULAIR) 10 MG tablet Take 10 mg by mouth at bedtime.    . Multiple Vitamins-Minerals (MULTIVITAMIN GUMMIES ADULTS PO) Take by mouth.    . Olopatadine HCl 0.6 % SOLN Place 2 drops (2 puffs total) into both nostrils 2 (two) times daily. 1 Bottle 0  . ondansetron (ZOFRAN ODT) 4 MG disintegrating tablet Take 1 tablet (4 mg total) by mouth every 8 (eight) hours as needed for nausea or vomiting. 20 tablet 0  . sucralfate (CARAFATE) 1 g tablet Take 1 tablet (1 g total) by mouth 4 (four) times daily -  with meals and at bedtime for 7 days. 28 tablet 0     No Known Allergies  REVIEW OF SYSTEMS: Skin:  No history of  rash.  No history of abnormal moles. Infection:  No history of hepatitis or HIV.  No history of MRSA. Neurologic:  No history of stroke.  No history of seizure.  No history of headaches. Cardiac:  HTN x 4 years.  No history of seeing a cardiologist. Pulmonary:  Does not smoke cigarettes.  No asthma or bronchitis.  No OSA/CPAP.  Endocrine:  No diabetes. No thyroid disease. Gastrointestinal:  See HPI. Urologic:  No history of kidney stones.  No history of bladder infections. Musculoskeletal:  No history of joint or back disease. Hematologic:  No bleeding disorder.  No history of anemia.  Not anticoagulated. Psycho-social:  The patient is oriented.   The patient has no obvious psychologic or social impairment to understanding our conversation and plan.  SOCIAL and FAMILY HISTORY: Married.  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  PHYSICAL EXAM: BP 133/77   Pulse 77   Temp 99.6 F (37.6 C) (Rectal)   Resp 17   Ht 5\' 7"  (1.702 m)   Wt 113 kg   LMP  (LMP Unknown)   SpO2 93%   BMI 39.02 kg/m   General: Obese WF who is alert.  She looks mildly uncomfortable. Skin:  Inspection and palpation - no mass or rash. Eyes:  Conjunctiva and lids unremarkable.            Pupils are equal Ears, Nose, Mouth, and Throat:  Ears and nose unremarkable            Lips and teeth are unremarable. Neck: Supple. No mass, trachea midline.  No thyroid mass. Lymph Nodes:  No supraclavicular, cervical, or inguinal nodes. Lungs: Normal respiratory effort.  Clear to auscultation and symmetric breath sounds. Heart:  Palpation of the heart is normal.            Auscultation: RRR. No murmur or rub.  Abdomen: Soft. No mass. No tenderness. No hernia.             Normal bowel sounds.  Upper midline scar. Probable hernia in epigastrium that is hard to feel.   Epigastric  tenderness Rectal: Not done. Musculoskeletal:  Good muscle strength and ROM  in upper and lower extremities.  Neurologic:  Grossly intact to motor and sensory function. Psychiatric: Normal judgement and insight. Behavior is normal.            Oriented to time, person, place.   DATA REVIEWED, COUNSELING AND COORDINATION OF CARE:  Epic notes reviewed. Counseling and coordination of care exceeded more than 50% of the time spent with patient. Total time spent with patient and charting: 45 minutes  Alphonsa Overall, MD,  [facility], [address]  [place], [place]    [place] Phone:  [phone] FAX:  [phone]

## 2018-06-01 NOTE — ED Triage Notes (Signed)
Pt c/o abd pain x 3 days , seen here x 1 day ago for same and seen  by PMD today for same

## 2018-06-01 NOTE — ED Notes (Addendum)
Attempted to call report to [facility]- unable to get answer at [phone] or [phone] multiple calls to each number.

## 2018-06-01 NOTE — ED Provider Notes (Signed)
[facility] EMERGENCY DEPARTMENT Provider Note   CSN: [id number] Arrival date & time: 06/01/18  1523    History   Chief Complaint Chief Complaint  Patient presents with  . Abdominal Pain    HPI Theresa Gilbert is a 53 y.o. female.     HPI  Pt is a 53 y/o female with a h/o GERD, HTN, cholecystectomy, who presents to the ED today c/o epigastric abd pain that began yesterday after she  had a coffee, fiber one bars, and large pizza. States pain rated 10/10. Pain feels like a stabbing/aching pain. Pain radiates up to her chest and has pain with inspiration . No SOB. She also reports nausea, vomiting, and burping. No diarrhea. States she last had a BM yesterday. No fevers.  She has tried taking pepcid and nexium but was not able to keep it down.   She states she drinks a few of wine glasses per week. Denies drug use.   Of note, patient seen yesterday in the ED for similar symptoms.  Note reviewed.  Patient had very thorough work-up including laboratory work and CT scan.  Laboratory work was reassuring, urinalysis was negative, and CT scan did not show any acute abnormalities in the abdomen or pelvis but did show a large hiatal hernia.  Past Medical History:  Diagnosis Date  . GERD (gastroesophageal reflux disease)   . Hypertension   . Sleep apnea     Patient Active Problem List   Diagnosis Date Noted  . Chronic rhinitis 06/04/2015    Past Surgical History:  Procedure Laterality Date  . arthritic cyst Left    wrist  . CHOLECYSTECTOMY    . GANGLION CYST EXCISION Left    wrist,  . wisdom teeth extaction       OB History   No obstetric history on file.      Home Medications    Prior to Admission medications   Medication Sig Start Date End Date Taking? Authorizing Provider  Calcium Citrate-Vitamin D (CALCIUM + D PO) Take by mouth.    [provider]  Cyanocobalamin (VITAMIN B-12 PO) Take by mouth.    [provider]  esomeprazole  (NEXIUM) 20 MG capsule Take 20 mg by mouth 2 (two) times daily.    [provider]  levocetirizine (XYZAL) 5 MG tablet Take 1 tablet (5 mg total) by mouth every evening. 06/04/15   Leda Roys, MD  losartan (COZAAR) 25 MG tablet Take 25 mg by mouth daily.    [provider]  montelukast (SINGULAIR) 10 MG tablet Take 10 mg by mouth at bedtime.    [provider]  Multiple Vitamins-Minerals (MULTIVITAMIN GUMMIES ADULTS PO) Take by mouth.    [provider]  Olopatadine HCl 0.6 % SOLN Place 2 drops (2 puffs total) into both nostrils 2 (two) times daily. 06/04/15   Leda Roys, MD  ondansetron (ZOFRAN ODT) 4 MG disintegrating tablet Take 1 tablet (4 mg total) by mouth every 8 (eight) hours as needed for nausea or vomiting. 05/31/18   Henderly, Britni A, PA-C  sucralfate (CARAFATE) 1 g tablet Take 1 tablet (1 g total) by mouth 4 (four) times daily -  with meals and at bedtime for 7 days. 05/31/18 06/07/18  Henderly, Britni A, PA-C    Family History Family History  Problem Relation Age of Onset  . Cancer Mother   . Diabetes Mother   . Heart disease Father   . Cancer Sister   . Heart disease  Maternal Grandmother   . Heart disease Maternal Grandfather     Social History Social History   Tobacco Use  . Smoking status: Never Smoker  . Smokeless tobacco: Never Used  Substance Use Topics  . Alcohol use: Yes    Comment: soc  . Drug use: No     Allergies   Patient has no known allergies.   Review of Systems Review of Systems  Constitutional: Negative for fever.  HENT: Negative for ear pain and sore throat.   Eyes: Negative for visual disturbance.  Respiratory: Negative for cough and shortness of breath.   Cardiovascular: Positive for chest pain. Negative for palpitations and leg swelling.  Gastrointestinal: Positive for abdominal pain, nausea and vomiting. Negative for constipation and diarrhea.  Genitourinary: Negative for dysuria and hematuria.   Musculoskeletal: Negative for back pain.  Skin: Negative for color change and rash.  Neurological: Negative for headaches.  All other systems reviewed and are negative.   Physical Exam Updated Vital Signs BP (!) 172/98   Pulse 89   Temp 98.5 F (36.9 C) (Oral)   Resp 18   Ht 5\' 7"  (1.702 m)   Wt 113 kg   LMP  (LMP Unknown)   SpO2 95%   BMI 39.02 kg/m   Physical Exam Vitals signs and nursing note reviewed.  Constitutional:      General: She is not in acute distress.    Appearance: She is well-developed. She is not ill-appearing or toxic-appearing.  HENT:     Head: Normocephalic and atraumatic.  Eyes:     Conjunctiva/sclera: Conjunctivae normal.  Neck:     Musculoskeletal: Neck supple.  Cardiovascular:     Rate and Rhythm: Regular rhythm. Tachycardia present.     Heart sounds: Normal heart sounds. No murmur.  Pulmonary:     Effort: Pulmonary effort is normal. No respiratory distress.     Breath sounds: Normal breath sounds. No wheezing, rhonchi or rales.     Comments: Left chest wall tenderness that reproduces pain Abdominal:     General: Bowel sounds are normal.     Palpations: Abdomen is soft.     Tenderness: There is generalized abdominal tenderness (worse to epigastric region) and tenderness in the epigastric area. There is no guarding or rebound.  Skin:    General: Skin is warm and dry.  Neurological:     Mental Status: She is alert.     ED Treatments / Results  Labs (all labs ordered are listed, but only abnormal results are displayed) Labs Reviewed  CBC WITH DIFFERENTIAL/PLATELET - Abnormal; Notable for the following components:      Result Value   WBC 12.7 (*)    Neutro Abs 10.9 (*)    All other components within normal limits  TROPONIN I  COMPREHENSIVE METABOLIC PANEL  LIPASE, BLOOD    EKG EKG Interpretation  Date/Time:  Tuesday Jun 01 2018 15:37:00 EDT Ventricular Rate:  93 PR Interval:    QRS Duration: 86 QT Interval:  363 QTC  Calculation: 452 R Axis:   42 Text Interpretation:  Sinus rhythm No significant change since last tracing Confirmed by Blanchie Dessert ([id number]) on 06/01/2018 4:27:53 PM   Radiology Dg Chest 2 View  Result Date: 06/01/2018 CLINICAL DATA:  Chest pain for 16 hours. EXAM: CHEST - 2 VIEW COMPARISON:  09/16/2015 FINDINGS: Cardiomediastinal silhouette is unremarkable. A moderate hiatal hernia is now distended with fluid and gas. The stomach appears mildly distended as well. Mild LEFT basilar atelectasis noted. No  pleural effusion, pneumothorax, definite airspace disease or acute bony abnormality. IMPRESSION: Moderate hiatal hernia, now distended with fluid and gas and suggestion of gaseous distension is well. This may represent gastric or bowel obstruction. Consider abdominal/pelvic CT as clinically indicated. Mild LEFT basilar atelectasis. Electronically Signed   By: Margarette Canada M.D.   On: 06/01/2018 16:58   Ct Abdomen Pelvis W Contrast  Result Date: 05/31/2018 CLINICAL DATA:  Abdominal pain for 4 hours since eating. EXAM: CT ABDOMEN AND PELVIS WITH CONTRAST TECHNIQUE: Multidetector CT imaging of the abdomen and pelvis was performed using the standard protocol following bolus administration of intravenous contrast. CONTRAST:  100 mL OMNIPAQUE IOHEXOL 300 MG/ML  SOLN COMPARISON:  CT abdomen and pelvis 11/26/2017. MRI abdomen 06/02/2009. FINDINGS: Lower chest: There is dependent atelectasis. No pleural or pericardial effusion. Hepatobiliary: Status post cholecystectomy. Hepatomegaly is again seen. Hemangioma in the right hepatic lobe measuring 5 x 6 cm is unchanged. Pancreas: Unremarkable. No pancreatic ductal dilatation or surrounding inflammatory changes. Spleen: Normal in size without focal abnormality. Adrenals/Urinary Tract: Adrenal glands are unremarkable. Kidneys are normal, without renal calculi, focal lesion, or hydronephrosis. Bladder is unremarkable. Stomach/Bowel: Stomach is within normal limits.  Appendix appears normal. No evidence of bowel wall thickening, distention, or inflammatory changes. Vascular/Lymphatic: No significant vascular findings are present. No enlarged abdominal or pelvic lymph nodes. Reproductive: Uterus and bilateral adnexa are unremarkable. Other: Moderate hiatal hernia containing approximately 50% the stomach is unchanged. Fat containing midline ventral hernia is also unchanged. Musculoskeletal: No acute or focal abnormality. IMPRESSION: No acute abnormality abdomen or pelvis. Hepatomegaly. 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 Moderate hiatal hernia and fat containing ventral hernia are unchanged. Electronically Signed   By: Inge Rise M.D.   On: 05/31/2018 19:52    Procedures Procedures (including critical care time)  Medications Ordered in ED Medications  morphine 4 MG/ML injection 4 mg (has no administration in time range)  metoCLOPramide (REGLAN) injection 10 mg (10 mg Intravenous Given 06/01/18 1627)  alum & mag hydroxide-simeth (MAALOX/MYLANTA) 200-200-20 MG/5ML suspension 30 mL (30 mLs Oral Given 06/01/18 1630)  famotidine (PEPCID) IVPB 20 mg premix (20 mg Intravenous New Bag/Given 06/01/18 1627)  sodium chloride 0.9 % bolus 500 mL (500 mLs Intravenous New Bag/Given 06/01/18 1629)  dicyclomine (BENTYL) injection 20 mg (20 mg Intramuscular Given 06/01/18 1630)     Initial Impression / Assessment and Plan / ED Course  I have reviewed the triage vital signs and the nursing notes.  Pertinent labs & imaging results that were available during my care of the patient were reviewed by me and considered in my medical decision making (see chart for details).   Final Clinical Impressions(s) / ED Diagnoses   Final diagnoses:  None   Pt presenting with epigastric abd pain, nausea, and vomiting. States pain radiates to the chest constantly all day.  Had thorough work-up yesterday in the ED including labs and CT scan which demonstrated moderate  hiatal hernia likely contributing to patient's symptoms.  Has not been able to keep foods down today despite medications at home therefore returned to the ED.   On exam patient is nontoxic and nonseptic appearing.  No acute distress.  She reports generalized ttp on exam, but she has no guarding rebound or rigidity on exam. Normoactive BS. The chest pain she complains of is reproducible on exam.  No crepitus to the chest wall.  Lungs are clear to auscultation bilaterally.  We will obtain laboratory work, EKG, CXR and administer  IV fluids, nausea medication and reassess.  CBC with mild leukocytosis. CMP with low bicarb, otherwise reassuring Lipase negative Lactic acid is negative.  Troponin is negative  EKG with NSR, no acute ischemic changes. No change from previus.  CXR with moderate hiatal hernia, now distended with fluid and gas and suggestion of gaseous distension is well. This may represent gastric or bowel obstruction. Consider abdominal/pelvic CT as clinically indicated. Mild LEFT basilar atelectasis.  5:42 PM Discussed case with Dr. Inge Rise who reviewed CT imaging from yesterday and CXR from today. On review, he states there is concern for possible paraesophageal hiatal hernia and may have partial obstruction or intermittent obstruction based on findings of CXR today.  He will addend CT report from yesterday.   CT addendum shows paraesophageal hernia with air-fluid levels both above and below the diaphragm. The gastroesophageal junction appears to be near the diaphragmatic hiatus. The duodenum is below the diaphragm. Mild to moderate gaseous distension components of the stomach above and below the diaphragm are worrisome for volvulus, possibly intermittent. There are no CT signs of ischemia or inflammation.   6:37 Discussed case with Dr. Lucia Gaskins with general surgery who recommended sending pt to [facility] ED for for general surgery eval.   6:39 PM Discussed case with Dr. Rex Kras  who accepts pt for transfer.  ED Discharge Orders    None       Bishop Dublin 06/01/18 1841    Blanchie Dessert, MD 06/01/18 2156

## 2018-06-02 ENCOUNTER — Other Ambulatory Visit: Payer: Self-pay

## 2018-06-02 ENCOUNTER — Encounter (HOSPITAL_COMMUNITY): Payer: Self-pay

## 2018-06-02 ENCOUNTER — Inpatient Hospital Stay (HOSPITAL_COMMUNITY): Payer: POS

## 2018-06-02 LAB — CBC WITH DIFFERENTIAL/PLATELET
Abs Immature Granulocytes: 0.08 10*3/uL — ABNORMAL HIGH (ref 0.00–0.07)
Basophils Absolute: 0 10*3/uL (ref 0.0–0.1)
Basophils Relative: 0 %
Eosinophils Absolute: 0 10*3/uL (ref 0.0–0.5)
Eosinophils Relative: 0 %
HCT: 33.9 % — ABNORMAL LOW (ref 36.0–46.0)
Hemoglobin: 10.5 g/dL — ABNORMAL LOW (ref 12.0–15.0)
Immature Granulocytes: 1 %
Lymphocytes Relative: 8 %
Lymphs Abs: 1.1 10*3/uL (ref 0.7–4.0)
MCH: 27.9 pg (ref 26.0–34.0)
MCHC: 31 g/dL (ref 30.0–36.0)
MCV: 89.9 fL (ref 80.0–100.0)
Monocytes Absolute: 1.4 10*3/uL — ABNORMAL HIGH (ref 0.1–1.0)
Monocytes Relative: 10 %
Neutro Abs: 11.7 10*3/uL — ABNORMAL HIGH (ref 1.7–7.7)
Neutrophils Relative %: 81 %
Platelets: 323 10*3/uL (ref 150–400)
RBC: 3.77 MIL/uL — ABNORMAL LOW (ref 3.87–5.11)
RDW: 13.7 % (ref 11.5–15.5)
WBC: 14.3 10*3/uL — ABNORMAL HIGH (ref 4.0–10.5)
nRBC: 0 % (ref 0.0–0.2)

## 2018-06-02 LAB — COMPREHENSIVE METABOLIC PANEL
ALT: 22 U/L (ref 0–44)
AST: 21 U/L (ref 15–41)
Albumin: 3.8 g/dL (ref 3.5–5.0)
Alkaline Phosphatase: 70 U/L (ref 38–126)
Anion gap: 4 — ABNORMAL LOW (ref 5–15)
BUN: 15 mg/dL (ref 6–20)
CO2: 25 mmol/L (ref 22–32)
Calcium: 8.5 mg/dL — ABNORMAL LOW (ref 8.9–10.3)
Chloride: 109 mmol/L (ref 98–111)
Creatinine, Ser: 0.56 mg/dL (ref 0.44–1.00)
GFR calc Af Amer: >60 mL/min (ref >60)
GFR calc non Af Amer: >60 mL/min (ref >60)
Glucose, Bld: 147 mg/dL — ABNORMAL HIGH (ref 70–99)
Potassium: 4.1 mmol/L (ref 3.5–5.1)
Sodium: 138 mmol/L (ref 135–145)
Total Bilirubin: 0.5 mg/dL (ref 0.3–1.2)
Total Protein: 7 g/dL (ref 6.5–8.1)

## 2018-06-02 LAB — HIV ANTIBODY (ROUTINE TESTING W REFLEX): HIV Screen 4th Generation wRfx: NONREACTIVE

## 2018-06-02 MED ORDER — PHENOL 1.4 % MT LIQD
1.0000 | OROMUCOSAL | Status: DC | PRN
  Administered 2018-06-02: 1 via OROMUCOSAL
  Filled 2018-06-02 (×2): qty 177

## 2018-06-02 NOTE — Progress Notes (Addendum)
[facility]/Trauma Progress Note      Assessment/Plan  Morbid obesity - BMI approximately 40 HTN  Epigastric hernia in old incision COVID-19 - negative  Hiatal hernia -  Apparent intermittent obstruction caused by the [facility] - CT 05/31/2018, showed a moderate HH. Addendum discusses paraesophageal hiatal hernia and concern of intermittent volvulus.  - plan is continue NGT and IVF - hopefully the pt improves this admission and we can avoid emergent surgery until the pt can loose weight and this can be repaired as an outpt. The recurrence rate for these in an obese pt are very high.  - AXR this am pending  FEN: NPO, NGT, IVF VTE: SCD's, lovenox ID: none, WBC 14.3, am labs, monitor, afebrile  Foley: none Follow up: TBD    LOS: 1 day    Subjective: CC: abdominal pain  Pain is improved since admission. Less pain with deep breaths. Continued left shoulder pain. No flatus since onset of severe pain. No BM. Discussed ideal plan with pt. She expressed understanding.   Objective: Vital signs in last 24 hours: Temp:  [98.2 F (36.8 C)-99.6 F (37.6 C)] 98.3 F (36.8 C) (05/13 0505) Pulse Rate:  [77-109] 84 (05/13 0505) Resp:  [16-24] 20 (05/13 0505) BP: (133-172)/(77-100) 138/86 (05/13 0505) SpO2:  [91 %-98 %] 97 % (05/13 0505) Weight:  [160 kg-117 kg] 117 kg (05/13 0015) Last BM Date: 05/31/18  Intake/Output from previous day: 05/12 0701 - 05/13 0700 In: 1550 [IV Piggyback:1550] Out: 150 [Emesis/NG output:150] Intake/Output this shift: Total I/O In: -  Out: 600 [Urine:600]  PE: Gen:  Alert, NAD, pleasant, cooperative HEENT: NGT in place with cloudy yellow output Card:  RRR, no M/G/R heard Pulm:  Rate and effort normal Abd: Soft, obese, ND, few BS, small supraumbilical hernia that is soft and reducible, mild TTP of epigastrium without guarding, no peritonitis  Skin: no rashes noted, warm and dry   Anti-infectives: Anti-infectives (From admission, onward)    None      Lab Results:  Recent Labs    06/01/18 1556 06/02/18 0519  WBC 12.7* 14.3*  HGB 12.4 10.5*  HCT 39.4 33.9*  PLT 285 323   BMET Recent Labs    06/01/18 1715 06/02/18 0519  NA 137 138  K 4.3 4.1  CL 107 109  CO2 20* 25  GLUCOSE 147* 147*  BUN 14 15  CREATININE 0.56 0.56  CALCIUM 8.7* 8.5*   PT/INR No results for input(s): LABPROT, INR in the last 72 hours. CMP     Component Value Date/Time   NA 138 06/02/2018 0519   K 4.1 06/02/2018 0519   CL 109 06/02/2018 0519   CO2 25 06/02/2018 0519   GLUCOSE 147 (H) 06/02/2018 0519   BUN 15 06/02/2018 0519   CREATININE 0.56 06/02/2018 0519   CREATININE 0.66 06/04/2015 1354   CALCIUM 8.5 (L) 06/02/2018 0519   PROT 7.0 06/02/2018 0519   ALBUMIN 3.8 06/02/2018 0519   AST 21 06/02/2018 0519   ALT 22 06/02/2018 0519   ALKPHOS 70 06/02/2018 0519   BILITOT 0.5 06/02/2018 0519   GFRNONAA >60 06/02/2018 0519   GFRNONAA >89 06/04/2015 1354   GFRAA >60 06/02/2018 0519   GFRAA >89 06/04/2015 1354   Lipase     Component Value Date/Time   LIPASE 28 06/01/2018 1715    Studies/Results: Dg Chest 2 View  Result Date: 06/01/2018 CLINICAL DATA:  Chest pain for 16 hours. EXAM: CHEST - 2 VIEW COMPARISON:  09/16/2015 FINDINGS: Cardiomediastinal  silhouette is unremarkable. A moderate hiatal hernia is now distended with fluid and gas. The stomach appears mildly distended as well. Mild LEFT basilar atelectasis noted. No pleural effusion, pneumothorax, definite airspace disease or acute bony abnormality. IMPRESSION: Moderate hiatal hernia, now distended with fluid and gas and suggestion of gaseous distension is well. This may represent gastric or bowel obstruction. Consider abdominal/pelvic CT as clinically indicated. Mild LEFT basilar atelectasis. Electronically Signed   By: Margarette Canada M.D.   On: 06/01/2018 16:58   Dg Abdomen 1 View  Result Date: 06/01/2018 CLINICAL DATA:  NG tube placement. EXAM: ABDOMEN - 1 VIEW COMPARISON:   None. FINDINGS: NG tube is in place with the tip and side-port in the stomach. IMPRESSION: NG tube in good position. Electronically Signed   By: Inge Rise M.D.   On: 06/01/2018 22:53   Ct Abdomen Pelvis W Contrast  Addendum Date: 06/01/2018   ADDENDUM REPORT: 06/01/2018 18:09 ADDENDUM: The patient return to the emergency department today and I discussed this case with the clinician caring for the patient. I was asked to review this examination as chest films today demonstrate increased gaseous distention of the stomach both above and below the diaphragm. Based on review, the patient has developed a paraesophageal hernia since the prior CT abdomen and pelvis. There are air-fluid levels both above and below the diaphragm. The gastroesophageal junction appears to be near the diaphragmatic hiatus. The duodenum is below the diaphragm. Mild to moderate gaseous distension components of the stomach above and below the diaphragm are worrisome for volvulus, possibly intermittent. There are no CT signs of ischemia or inflammation. Electronically Signed   By: Inge Rise M.D.   On: 06/01/2018 18:09   Result Date: 06/01/2018 CLINICAL DATA:  Abdominal pain for 4 hours since eating. EXAM: CT ABDOMEN AND PELVIS WITH CONTRAST TECHNIQUE: Multidetector CT imaging of the abdomen and pelvis was performed using the standard protocol following bolus administration of intravenous contrast. CONTRAST:  100 mL OMNIPAQUE IOHEXOL 300 MG/ML  SOLN COMPARISON:  CT abdomen and pelvis 11/26/2017. MRI abdomen 06/02/2009. FINDINGS: Lower chest: There is dependent atelectasis. No pleural or pericardial effusion. Hepatobiliary: Status post cholecystectomy. Hepatomegaly is again seen. Hemangioma in the right hepatic lobe measuring 5 x 6 cm is unchanged. Pancreas: Unremarkable. No pancreatic ductal dilatation or surrounding inflammatory changes. Spleen: Normal in size without focal abnormality. Adrenals/Urinary Tract: Adrenal glands are  unremarkable. Kidneys are normal, without renal calculi, focal lesion, or hydronephrosis. Bladder is unremarkable. Stomach/Bowel: Stomach is within normal limits. Appendix appears normal. No evidence of bowel wall thickening, distention, or inflammatory changes. Vascular/Lymphatic: No significant vascular findings are present. No enlarged abdominal or pelvic lymph nodes. Reproductive: Uterus and bilateral adnexa are unremarkable. Other: Moderate hiatal hernia containing approximately 50% the stomach is unchanged. Fat containing midline ventral hernia is also unchanged. Musculoskeletal: No acute or focal abnormality. IMPRESSION: No acute abnormality abdomen or pelvis. Hepatomegaly. 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 Moderate hiatal hernia and fat containing ventral hernia are unchanged. Electronically Signed: By: Inge Rise M.D. On: 05/31/2018 19:52      Kalman Drape , [facility] 06/02/2018, 8:43 AM  Pager: [phone] Mon-Wed, Friday 7:00am-4:30pm Thurs 7am-11:30am  Consults: [phone]

## 2018-06-03 LAB — CBC
HCT: 33.5 % — ABNORMAL LOW (ref 36.0–46.0)
Hemoglobin: 10.3 g/dL — ABNORMAL LOW (ref 12.0–15.0)
MCH: 27.5 pg (ref 26.0–34.0)
MCHC: 30.7 g/dL (ref 30.0–36.0)
MCV: 89.3 fL (ref 80.0–100.0)
Platelets: 276 10*3/uL (ref 150–400)
RBC: 3.75 MIL/uL — ABNORMAL LOW (ref 3.87–5.11)
RDW: 13.6 % (ref 11.5–15.5)
WBC: 11.1 10*3/uL — ABNORMAL HIGH (ref 4.0–10.5)
nRBC: 0 % (ref 0.0–0.2)

## 2018-06-03 MED ORDER — ACETAMINOPHEN 325 MG PO TABS: 650.0000 mg | ORAL_TABLET | Freq: Four times a day (QID) | ORAL | Status: DC | PRN

## 2018-06-03 MED ORDER — LORATADINE 10 MG PO TABS
10.0000 mg | ORAL_TABLET | Freq: Every day | ORAL | Status: DC
  Administered 2018-06-03 – 2018-06-04 (×2): 10 mg via ORAL
  Filled 2018-06-03 (×2): qty 1

## 2018-06-03 MED ORDER — MENTHOL 3 MG MT LOZG
1.0000 | LOZENGE | OROMUCOSAL | Status: DC | PRN
  Administered 2018-06-03: 3 mg via ORAL
  Filled 2018-06-03: qty 9

## 2018-06-03 MED ORDER — MONTELUKAST SODIUM 10 MG PO TABS
10.0000 mg | ORAL_TABLET | Freq: Every day | ORAL | Status: DC
  Administered 2018-06-03: 22:00:00 10 mg via ORAL
  Filled 2018-06-03: qty 1

## 2018-06-03 MED ORDER — PSEUDOEPHEDRINE HCL ER 120 MG PO TB12
120.0000 mg | ORAL_TABLET | Freq: Two times a day (BID) | ORAL | Status: DC
  Administered 2018-06-03 (×2): 120 mg via ORAL
  Filled 2018-06-03 (×4): qty 1

## 2018-06-03 NOTE — Progress Notes (Signed)
Pt complaining of nausea with clears and NG clamped,  Will resume suction with NG.

## 2018-06-03 NOTE — Progress Notes (Signed)
[facility]/Trauma Progress Note      Assessment/Plan Morbid obesity - BMI approximately 40 HTN  Epigastric hernia in old incision COVID-19 - negative  Hiatal hernia -  Apparent intermittent obstruction caused by the [facility] - CT 05/31/2018,showed a moderate HH. Addendum discusses paraesophageal hiatal hernia and concern of intermittent volvulus.  - plan is continue NGT and IVF - hopefully the pt improves this admission and we can avoid emergent surgery until the pt can loose weight and this can be repaired as an outpt. The recurrence rate for these in an obese pt are very high.  - clamping trail today to see if pt has return of pain  FEN: NPO, NGT, IVF - NGT clamping trial  VTE: SCD's, lovenox ID: none, WBC 11.1, am labs, monitor, afebrile  Foley: none Follow up: TBD   LOS: 2 days    Subjective: CC: sore throat and sinus congestion   No abdominal pain. More complains of sinus congestion and headache. Pt has allergies and hasn't taken medicine in a few days. She is having flatus.   Objective: Vital signs in last 24 hours: Temp:  [98.6 F (37 C)-100 F (37.8 C)] 99 F (37.2 C) (05/14 0548) Pulse Rate:  [80-102] 97 (05/14 0548) Resp:  [16-20] 20 (05/14 0548) BP: (137-145)/(75-92) 137/75 (05/14 0548) SpO2:  [92 %-96 %] 94 % (05/14 0548) Last BM Date: 05/31/18  Intake/Output from previous day: 05/13 0701 - 05/14 0700 In: 2682.4 [P.O.:120; I.V.:2562.4] Out: 2200 [Urine:1000; Emesis/NG output:1200] Intake/Output this shift: No intake/output data recorded.  PE: Gen:  Alert, NAD, pleasant, cooperative HEENT: NGT in place with clear pink output  Pulm:  Rate and effort normal Abd: Soft, obese, ND, + BS, no TTP Skin: no rashes noted, warm and dry    Anti-infectives: Anti-infectives (From admission, onward)   None      Lab Results:  Recent Labs    06/02/18 0519 06/03/18 0742  WBC 14.3* 11.1*  HGB 10.5* 10.3*  HCT 33.9* 33.5*  PLT 323 276    BMET Recent Labs    06/01/18 1715 06/02/18 0519  NA 137 138  K 4.3 4.1  CL 107 109  CO2 20* 25  GLUCOSE 147* 147*  BUN 14 15  CREATININE 0.56 0.56  CALCIUM 8.7* 8.5*   PT/INR No results for input(s): LABPROT, INR in the last 72 hours. CMP     Component Value Date/Time   NA 138 06/02/2018 0519   K 4.1 06/02/2018 0519   CL 109 06/02/2018 0519   CO2 25 06/02/2018 0519   GLUCOSE 147 (H) 06/02/2018 0519   BUN 15 06/02/2018 0519   CREATININE 0.56 06/02/2018 0519   CREATININE 0.66 06/04/2015 1354   CALCIUM 8.5 (L) 06/02/2018 0519   PROT 7.0 06/02/2018 0519   ALBUMIN 3.8 06/02/2018 0519   AST 21 06/02/2018 0519   ALT 22 06/02/2018 0519   ALKPHOS 70 06/02/2018 0519   BILITOT 0.5 06/02/2018 0519   GFRNONAA >60 06/02/2018 0519   GFRNONAA >89 06/04/2015 1354   GFRAA >60 06/02/2018 0519   GFRAA >89 06/04/2015 1354   Lipase     Component Value Date/Time   LIPASE 28 06/01/2018 1715    Studies/Results: Dg Chest 2 View  Result Date: 06/01/2018 CLINICAL DATA:  Chest pain for 16 hours. EXAM: CHEST - 2 VIEW COMPARISON:  09/16/2015 FINDINGS: Cardiomediastinal silhouette is unremarkable. A moderate hiatal hernia is now distended with fluid and gas. The stomach appears mildly distended as well. Mild LEFT basilar  atelectasis noted. No pleural effusion, pneumothorax, definite airspace disease or acute bony abnormality. IMPRESSION: Moderate hiatal hernia, now distended with fluid and gas and suggestion of gaseous distension is well. This may represent gastric or bowel obstruction. Consider abdominal/pelvic CT as clinically indicated. Mild LEFT basilar atelectasis. Electronically Signed   By: Margarette Canada M.D.   On: 06/01/2018 16:58   Dg Abdomen 1 View  Result Date: 06/01/2018 CLINICAL DATA:  NG tube placement. EXAM: ABDOMEN - 1 VIEW COMPARISON:  None. FINDINGS: NG tube is in place with the tip and side-port in the stomach. IMPRESSION: NG tube in good position. Electronically Signed   By:  Inge Rise M.D.   On: 06/01/2018 22:53   Dg Abd Portable 2v  Result Date: 06/02/2018 CLINICAL DATA:  Upper abdominal pain. EXAM: PORTABLE ABDOMEN - 2 VIEW COMPARISON:  Radiograph Jun 01, 2018. FINDINGS: The bowel gas pattern is normal. There is no evidence of free air. Nasogastric tube is looped within proximal stomach. Status post cholecystectomy. Phleboliths are noted in the pelvis. IMPRESSION: Nasogastric tube seen in proximal stomach. No evidence of bowel obstruction or ileus. Electronically Signed   By: Marijo Conception M.D.   On: 06/02/2018 10:11      Kalman Drape , [facility] Surgery 06/03/2018, 8:42 AM  Pager: [phone] Mon-Wed, Friday 7:00am-4:30pm Thurs 7am-11:30am  Consults: [phone]

## 2018-06-04 ENCOUNTER — Inpatient Hospital Stay (HOSPITAL_COMMUNITY): Payer: POS

## 2018-06-04 LAB — CBC
HCT: 32.5 % — ABNORMAL LOW (ref 36.0–46.0)
Hemoglobin: 10.1 g/dL — ABNORMAL LOW (ref 12.0–15.0)
MCH: 27.6 pg (ref 26.0–34.0)
MCHC: 31.1 g/dL (ref 30.0–36.0)
MCV: 88.8 fL (ref 80.0–100.0)
Platelets: 269 10*3/uL (ref 150–400)
RBC: 3.66 MIL/uL — ABNORMAL LOW (ref 3.87–5.11)
RDW: 13.6 % (ref 11.5–15.5)
WBC: 8.8 10*3/uL (ref 4.0–10.5)
nRBC: 0 % (ref 0.0–0.2)

## 2018-06-04 MED ORDER — ACETAMINOPHEN 325 MG PO TABS: 650.0000 mg | ORAL_TABLET | Freq: Four times a day (QID) | ORAL | Status: AC | PRN

## 2018-06-04 NOTE — Discharge Summary (Signed)
[facility] Discharge Summary   Patient ID: Theresa Gilbert MRN: [id number] DOB/AGE: 04-13-1965 53 y.o.  Admit date: 06/01/2018 Discharge date: 06/04/2018  Admitting Diagnosis: Hiatal hernia with obstruction   Discharge Diagnosis Hiatal hernia Morbid obesity  Consultants None  Imaging: Dg Chest Port 1 View  Result Date: 06/04/2018 CLINICAL DATA:  53 year old female with hiatal hernia. Intermittent obstruction. Query gastric distension. EXAM: PORTABLE CHEST 1 VIEW COMPARISON:  Chest radiographs 06/01/2018 and earlier. FINDINGS: Portable AP semi upright view at 0744 hours. Enteric tube remains in place and is looped in the left upper quadrant similar to the prior. There is now a paucity of bowel gas both in the hiatal hernia and the upper abdomen. No gastric distension is evident. Other mediastinal contours are within normal limits. Stable ventilation. No pneumothorax. IMPRESSION: 1. Enteric tube appears stable. 2. Paucity of gastric air both in the hernia and the upper abdomen. No gastric distention is evident. 3. Stable ventilation. Electronically Signed   By: Genevie Ann M.D.   On: 06/04/2018 08:18    Procedures None  Hospital Course:  Patient is a 53 year old female who presented to [facility] with nausea and vomiting.  Workup showed hiatal hernia with obstructive symptoms.  Patient was admitted and treated conservatively with NGT decompression. Nasogastric tube removed 5/15 and patient was able to tolerated a liquid diet. She was having bowel function.   On 06/04/18, the patient was voiding well, tolerating diet, ambulating well, pain well controlled, vital signs stableand felt stable for discharge home. She will discharge home on a full liquid diet for 5-7 days followed by a mechanical soft diet and she verbalized understanding of this. Patient will follow up in our office in 2-3 weeks and knows to call with questions or concerns. She will call to confirm appointment  date/time.     Allergies as of 06/04/2018   No Known Allergies     Medication List    STOP taking these medications   levocetirizine 5 MG tablet Commonly known as:  XYZAL   Olopatadine HCl 0.6 % Soln     TAKE these medications   acetaminophen 325 MG tablet Commonly known as:  TYLENOL Take 2 tablets (650 mg total) by mouth every 6 (six) hours as needed for mild pain, fever or headache.   CALCIUM + D PO Take 1 tablet by mouth daily.   esomeprazole 20 MG capsule Commonly known as:  NEXIUM Take 20 mg by mouth 2 (two) times daily.   losartan 50 MG tablet Commonly known as:  COZAAR Take 50 mg by mouth daily.   montelukast 10 MG tablet Commonly known as:  SINGULAIR Take 10 mg by mouth at bedtime.   MULTIVITAMIN GUMMIES ADULTS PO Take 1 tablet by mouth daily.   ondansetron 4 MG disintegrating tablet Commonly known as:  Zofran ODT Take 1 tablet (4 mg total) by mouth every 8 (eight) hours as needed for nausea or vomiting.   sucralfate 1 g tablet Commonly known as:  Carafate Take 1 tablet (1 g total) by mouth 4 (four) times daily -  with meals and at bedtime for 7 days.   VITAMIN B-12 PO Take 1 tablet by mouth daily.        Follow-up Information    Clovis Riley, MD. Call.   Specialty:  General Surgery Why:  Call and schedule an appointment to be seen in 2-3 weeks for follow up regarding hiatal hernia.  Contact information: [address]  [place] [id number] [phone]           Signed: Brigid Re, [facility] 06/04/2018, 3:48 PM Pager: [phone]

## 2018-06-04 NOTE — Progress Notes (Signed)
[facility]/Trauma Progress Note      Assessment/Plan Morbid obesity - BMI 40.4 HTN  Epigastric hernia in old incision COVID-19 - negative  Hiatal hernia -  Apparent intermittent obstruction caused by the [facility] - CT 05/31/2018,showed a moderate HH. Addendum discusses paraesophageal hiatal hernia and concern of intermittent volvulus.  - plan is continue NGT and IVF - hopefully the pt improves this admission and we can avoid emergent surgery until the pt can loose weight and this can be repaired as an outpt. The recurrence rate for these in an obese pt are very high.  - CXR this morning. If gastric bubble in chest is decompressed, may try just removing the tube and trying clears since she describes more throat irritation and is not really having gastric-based symptoms. We had a long discussion this morning about the potential need for surgery, how the surgery is done, and the risks including bleeding, infection, pain, scarring, injury to intraabdominal or mediastinal structures, recurrence of hiatal hernia, as well as DVT/PE, pneumonia, acute cardiac event, etc. Questions welcomed and answered. She has a good understanding of her disease process and the risks of surgery vs no surgery.  FEN: NPO, NGT, IVF   VTE: SCD's, lovenox ID: none, WBC 8.8, afebrile  Foley: none Follow up: TBD   LOS: 3 days    Subjective: CC: sore throat and sinus congestion   She states that last night she was having gagging from irritation of the tube in her throat. Does not think she was having nausea and has had none of the pain which brought her in. Does report some burping. Walking, using IS. Had a bowel movement this AM.   Objective: Vital signs in last 24 hours: Temp:  [99.2 F (37.3 C)-99.5 F (37.5 C)] 99.2 F (37.3 C) (05/15 0539) Pulse Rate:  [98-104] 104 (05/15 0539) Resp:  [16-18] 18 (05/15 0539) BP: (123-139)/(64-89) 135/77 (05/15 0539) SpO2:  [94 %-95 %] 95 % (05/15 0539) Last BM  Date: 05/31/18  Intake/Output from previous day: 05/14 0701 - 05/15 0700 In: 2854.4 [P.O.:600; I.V.:2254.4] Out: 1840 [Urine:640; Emesis/NG output:1200] Intake/Output this shift: No intake/output data recorded.  PE: Gen:  Alert, NAD, pleasant, cooperative HEENT: NGT in place with clear pink output  Pulm:  Rate and effort normal Abd: Soft, obese, ND, + BS, no TTP Skin: no rashes noted, warm and dry    Anti-infectives: Anti-infectives (From admission, onward)   None      Lab Results:  Recent Labs    06/03/18 0742 06/04/18 0425  WBC 11.1* 8.8  HGB 10.3* 10.1*  HCT 33.5* 32.5*  PLT 276 269   BMET Recent Labs    06/01/18 1715 06/02/18 0519  NA 137 138  K 4.3 4.1  CL 107 109  CO2 20* 25  GLUCOSE 147* 147*  BUN 14 15  CREATININE 0.56 0.56  CALCIUM 8.7* 8.5*   PT/INR No results for input(s): LABPROT, INR in the last 72 hours. CMP     Component Value Date/Time   NA 138 06/02/2018 0519   K 4.1 06/02/2018 0519   CL 109 06/02/2018 0519   CO2 25 06/02/2018 0519   GLUCOSE 147 (H) 06/02/2018 0519   BUN 15 06/02/2018 0519   CREATININE 0.56 06/02/2018 0519   CREATININE 0.66 06/04/2015 1354   CALCIUM 8.5 (L) 06/02/2018 0519   PROT 7.0 06/02/2018 0519   ALBUMIN 3.8 06/02/2018 0519   AST 21 06/02/2018 0519   ALT 22 06/02/2018 0519   ALKPHOS  70 06/02/2018 0519   BILITOT 0.5 06/02/2018 0519   GFRNONAA >60 06/02/2018 0519   GFRNONAA >89 06/04/2015 1354   GFRAA >60 06/02/2018 0519   GFRAA >89 06/04/2015 1354   Lipase     Component Value Date/Time   LIPASE 28 06/01/2018 1715    Studies/Results: Dg Abd Portable 2v  Result Date: 06/02/2018 CLINICAL DATA:  Upper abdominal pain. EXAM: PORTABLE ABDOMEN - 2 VIEW COMPARISON:  Radiograph Jun 01, 2018. FINDINGS: The bowel gas pattern is normal. There is no evidence of free air. Nasogastric tube is looped within proximal stomach. Status post cholecystectomy. Phleboliths are noted in the pelvis. IMPRESSION: Nasogastric  tube seen in proximal stomach. No evidence of bowel obstruction or ileus. Electronically Signed   By: Marijo Conception M.D.   On: 06/02/2018 10:11      Clovis Riley , [facility] 06/04/2018, 7:37 AM

## 2018-06-04 NOTE — Discharge Instructions (Signed)
Hiatal Hernia  A hiatal hernia occurs when part of the stomach slides above the muscle that separates the abdomen from the chest (diaphragm). A person can be born with a hiatal hernia (congenital), or it may develop over time. In almost all cases of hiatal hernia, only the top part of the stomach pushes through the diaphragm. Many people have a hiatal hernia with no symptoms. The larger the hernia, the more likely it is that you will have symptoms. In some cases, a hiatal hernia allows stomach acid to flow back into the tube that carries food from your mouth to your stomach (esophagus). This may cause heartburn symptoms. Severe heartburn symptoms may mean that you have developed a condition called gastroesophageal reflux disease (GERD). What are the causes? This condition is caused by a weakness in the opening (hiatus) where the esophagus passes through the diaphragm to attach to the upper part of the stomach. A person may be born with a weakness in the hiatus, or a weakness can develop over time. What increases the risk? This condition is more likely to develop in:  Older people. Age is a major risk factor for a hiatal hernia, especially if you are over the age of 32.  Pregnant women.  People who are overweight.  People who have frequent constipation. What are the signs or symptoms? Symptoms of this condition usually develop in the form of GERD symptoms. Symptoms include:  Heartburn.  Belching.  Indigestion.  Trouble swallowing.  Coughing or wheezing.  Sore throat.  Hoarseness.  Chest pain.  Nausea and vomiting. How is this diagnosed? This condition may be diagnosed during testing for GERD. Tests that may be done include:  X-rays of your stomach or chest.  An upper gastrointestinal (GI) series. This is an X-ray exam of your GI tract that is taken after you swallow a chalky liquid that shows up clearly on the X-ray.  Endoscopy. This is a procedure to look into your stomach  using a thin, flexible tube that has a tiny camera and light on the end of it. How is this treated? This condition may be treated by:  Dietary and lifestyle changes to help reduce GERD symptoms.  Medicines. These may include: ? Over-the-counter antacids. ? Medicines that make your stomach empty more quickly. ? Medicines that block the production of stomach acid (H2 blockers). ? Stronger medicines to reduce stomach acid (proton pump inhibitors).  Surgery to repair the hernia, if other treatments are not helping. If you have no symptoms, you may not need treatment. Follow these instructions at home: Lifestyle and activity  Do not use any products that contain nicotine or tobacco, such as cigarettes and e-cigarettes. If you need help quitting, ask your health care provider.  Try to achieve and maintain a healthy body weight.  Avoid putting pressure on your abdomen. Anything that puts pressure on your abdomen increases the amount of acid that may be pushed up into your esophagus. ? Avoid bending over, especially after eating. ? Raise the head of your bed by putting blocks under the legs. This keeps your head and esophagus higher than your stomach. ? Do not wear tight clothing around your chest or stomach. ? Try not to strain when having a bowel movement, when urinating, or when lifting heavy objects. Eating and drinking  Avoid foods that can worsen GERD symptoms. These may include: ? Fatty foods, like fried foods. ? Citrus fruits, like oranges or lemon. ? Other foods and drinks that contain acid, like  orange juice or tomatoes. ? Spicy food. ? Chocolate.  Eat frequent small meals instead of three large meals a day. This helps prevent your stomach from getting too full. ? Eat slowly. ? Do not lie down right after eating. ? Do not eat 1-2 hours before bed.  Do not drink beverages with caffeine. These include cola, coffee, cocoa, and tea.  Do not drink alcohol. General  instructions  Take over-the-counter and prescription medicines only as told by your health care provider.  Keep all follow-up visits as told by your health care provider. This is important. Contact a health care provider if:  Your symptoms are not controlled with medicines or lifestyle changes.  You are having trouble swallowing.  You have coughing or wheezing that will not go away. Get help right away if:  Your pain is getting worse.  Your pain spreads to your arms, neck, jaw, teeth, or back.  You have shortness of breath.  You sweat for no reason.  You feel sick to your stomach (nauseous) or you vomit.  You vomit blood.  You have bright red blood in your stools.  You have black, tarry stools. This information is not intended to replace advice given to you by your health care provider. Make sure you discuss any questions you have with your health care provider. Document Released: 03/29/2003 Document Revised: 08/11/2016 Document Reviewed: 08/11/2016 Elsevier Interactive Patient Education  2019 [place].  Follow Full Liquid diet for 5-7 days and then slowly transition to SOFT/Pureed diet   Full Liquid Diet A full liquid diet refers to fluids and foods that are liquid or will become liquid at room temperature. This diet should only be used for a short period of time to help you recover from illness or surgery. Your health care provider or dietitian will help you determine when it is safe to eat regular foods. What are tips for following this plan?     Reading food labels  Check food labels of nutrition shakes for the amount of protein. Look for nutrition shakes that have at least 8-10 grams of protein in each serving.  Look for drinks, such as milks and juices, that are "fortified" or "enriched." This means that vitamins and minerals have been added. Shopping  Buy premade nutritional shakes to keep on hand.  To vary your choices, buy different flavors of milks  and shakes. Meal planning  Choose flavors and foods that you enjoy.  To make sure you get enough energy from food (calories): ? Eat 3 full liquid meals each day. Have a liquid snack between each meal. ? Drink 6-8 ounces (177-237 ml) of a nutrition supplement shake with meals or as snacks. ? Add protein powder, powdered milk, milk, or yogurt to shakes to increase the amount of protein.  Drink at least one serving a day of citrus fruit juice or fruit juice that has vitamin C added. General guidelines  Before starting the full-liquid diet, check with your health care provider to know what foods you should avoid. These may include full-fat or high-fiber liquids.  You may have any liquid or food that becomes a liquid at room temperature. The food is considered a liquid if it can be poured off a spoon at room temperature.  Do not drink alcohol unless approved by your health care provider.  This diet gives you most of the nutrients that you need for energy, but you may not get enough of certain vitamins, minerals, and fiber. Make sure to talk  to your health care provider or dietitian about: ? How many calories you need to eat get day. ? How much fluid you should have each day. ? Taking a multivitamin or a nutritional supplement. What foods are allowed? The items listed may not be a complete list. Talk with your dietitian about what dietary choices are best for you. Grains Thin hot cereal, such as cream of wheat. Soft-cooked pasta or rice pured in soup. Vegetables Pulp-free tomato or vegetable juice. Vegetables pured in soup. Fruits Fruit juice without pulp. Strained fruit pures (seeds and skins removed). Meats and other protein foods Beef, chicken, and fish broths. Powdered protein supplements. Dairy Milk and milk-based beverages, including milk shakes and instant breakfast mixes. Smooth yogurt. Pured cottage cheese. Beverages Water. Coffee and tea (caffeinated or decaffeinated).  Cocoa. Liquid nutritional supplements. Soft drinks. Nondairy milks, such as almond, coconut, rice, or soy milk. Fats and oils Melted margarine and butter. Cream. Canola, almond, avocado, corn, grapeseed, sunflower, and sesame oils. Gravy. Sweets and desserts Custard. Pudding. Flavored gelatin. Smooth ice cream (without nuts or candy pieces). Sherbet. Popsicles. [place] ice. Pudding pops. Seasoning and other foods Salt and pepper. Spices. Cocoa powder. Vinegar. Ketchup. Yellow mustard. Smooth sauces, such as [place], cheese sauce, or white sauce. Soy sauce. Cream soups. Strained soups. Syrup. Honey. Jelly (without fruit pieces). What foods are not allowed? The items listed may not be a complete list. Talk with your dietitian about what dietary choices are best for you. Grains Whole grains. Pasta. Rice. Cold cereal. Bread. Crackers. Vegetables All whole fresh, frozen, or canned vegetables. Fruits All whole fresh, frozen, or canned fruits. Meats and other protein foods All cuts of meat, poultry, and fish. Eggs. Tofu and soy protein. Nuts and nut butters. Lunch meat. Sausage. Dairy Hard cheese. Yogurt with fruit chunks. Fats and oils Coconut oil. Palm oil. Lard. Cold butter. Sweets and desserts Ice cream or other frozen desserts that have any solids in them or on top, such as nuts, chocolate chips, and pieces of cookies. Cakes. Cookies. Candy. Seasoning and other foods Stone-ground mustards. Soups with chunks or pieces. Summary  A full liquid diet refers to fluids and foods that are liquid or will become liquid at room temperature.  This diet should only be used for a short period of time to help you recover from illness or surgery. Ask your health care provider or dietitian when it is safe for you to eat regular foods.  To make sure you get enough calories and nutrients, eat 3 meals each day with snacks between. Drink premade nutrition supplement shakes or add protein powder to  homemade shakes. Take a vitamin and mineral supplement as told by your health care provider. This information is not intended to replace advice given to you by your health care provider. Make sure you discuss any questions you have with your health care provider. Document Released: 01/06/2005 Document Revised: 02/20/2016 Document Reviewed: 02/20/2016 Elsevier Interactive Patient Education  2019 [place] A soft-food eating plan includes foods that are safe and easy to chew and swallow. Your health care provider or dietitian can help you find foods and flavors that fit into this plan. Follow this plan until your health care provider or dietitian says it is safe to start eating other foods and food textures. What are tips for following this plan? General guidelines   Take small bites of food, or cut food into pieces about  inch or smaller. Bite-sized pieces of food  are easier to chew and swallow.  Eat moist foods. Avoid overly dry foods.  Avoid foods that: ? Are difficult to swallow, such as dry, chunky, crispy, or sticky foods. ? Are difficult to chew, such as hard, tough, or stringy foods. ? Contain nuts, seeds, or fruits.  Follow instructions from your dietitian about the types of liquids that are safe for you to swallow. You may be allowed to have: ? Thick liquids only. This includes only liquids that are thicker than honey. ? Thin and thick liquids. This includes all beverages and foods that become liquid at room temperature.  To make thick liquids: ? Purchase a commercial liquid thickening powder. These are available at grocery stores and pharmacies. ? Mix the thickener into liquids according to instructions on the label. ? Purchase ready-made thickened liquids. ? Thicken soup by pureeing, straining to remove chunks, and adding flour, potato flakes, or corn starch. ? Add commercial thickener to foods that become liquid at room temperature, such as milk  shakes, yogurt, ice cream, gelatin, and sherbet.  Ask your health care provider whether you need to take a fiber supplement. Cooking  Cook meats so they stay tender and moist. Use methods like braising, stewing, or baking in liquid.  Cook vegetables and fruit until they are soft enough to be mashed with a fork.  Peel soft, fresh fruits such as peaches, nectarines, and melons.  When making soup, make sure chunks of meat and vegetables are smaller than  inch.  Reheat leftover foods slowly so that a tough crust does not form. What foods are allowed? The items listed below may not be a complete list. Talk with your dietitian about what dietary choices are best for you. Grains Breads, muffins, pancakes, or waffles moistened with syrup, jelly, or butter. Dry cereals well-moistened with milk. Moist, cooked cereals. Well-cooked pasta and rice. Vegetables All soft-cooked vegetables. Shredded lettuce. Fruits All canned and cooked fruits. Soft, peeled fresh fruits. Strawberries. Dairy Milk. Cream. Yogurt. Cottage cheese. Soft cheese without the rind. Meats and other protein foods Tender, moist ground meat, poultry, or fish. Meat cooked in gravy or sauces. Eggs. Sweets and desserts Ice cream. Milk shakes. Sherbet. Pudding. Fats and oils Butter. Margarine. Olive, canola, sunflower, and grapeseed oil. Smooth salad dressing. Smooth cream cheese. Mayonnaise. Gravy. What foods are not allowed? The items listed bemay not be a complete list. Talk with your dietitian about what dietary choices are best for you. Grains Coarse or dry cereals, such as bran, granola, and shredded wheat. Tough or chewy crusty breads, such as [place] bread or baguettes. Breads with nuts, seeds, or fruit. Vegetables All raw vegetables. Cooked corn. Cooked vegetables that are tough or stringy. Tough, crisp, fried potatoes and potato skins. Fruits Fresh fruits with skins or seeds, or both, such as apples, pears, and grapes.  Stringy, high-pulp fruits, such as papaya, pineapple, coconut, and mango. Fruit leather and all dried fruit. Dairy Yogurt with nuts or coconut. Meats and other protein foods Hard, dry sausages. Dry meat, poultry, or fish. Meats with gristle. Fish with bones. Fried meat or fish. Lunch meat and hotdogs. Nuts and seeds. Chunky peanut butter or other nut butters. Sweets and desserts Cakes or cookies that are very dry or chewy. Desserts with dried fruit, nuts, or coconut. Fried pastries. Very rich pastries. Fats and oils Cream cheese with fruit or nuts. Salad dressings with seeds or chunks. Summary  A soft-food eating plan includes foods that are safe and easy to swallow. Generally, the  foods should be soft enough to be mashed with a fork.  Avoid foods that are dry, hard to chew, crunchy, sticky, stringy, or crispy.  Ask your health care provider whether you need to thicken your liquids and if you need to take a fiber supplement. This information is not intended to replace advice given to you by your health care provider. Make sure you discuss any questions you have with your health care provider. Document Released: 04/15/2007 Document Revised: 03/11/2016 Document Reviewed: 03/11/2016 Elsevier Interactive Patient Education  2019 [employer].

## 2018-06-04 NOTE — Progress Notes (Signed)
Discharge and medication instructions reviewed with patient. Questions answered and patient denies further questions. No prescriptions given. Patient was given a doctor's note. Spouse is en route to drive patient home. Donne Hazel, RN

## 2018-07-09 ENCOUNTER — Encounter (HOSPITAL_COMMUNITY): Payer: Self-pay | Admitting: Emergency Medicine

## 2018-07-09 ENCOUNTER — Other Ambulatory Visit: Payer: Self-pay

## 2018-07-09 ENCOUNTER — Inpatient Hospital Stay (HOSPITAL_COMMUNITY)
Admission: EM | Admit: 2018-07-09 | Discharge: 2018-07-13 | DRG: 326 | Disposition: A | Discharge status: Home / Self Care | Payer: POS | Attending: General Surgery | Admitting: General Surgery

## 2018-07-09 ENCOUNTER — Emergency Department (HOSPITAL_COMMUNITY): Payer: POS

## 2018-07-09 DIAGNOSIS — I10 Essential (primary) hypertension: Secondary | ICD-10-CM | POA: Diagnosis present

## 2018-07-09 DIAGNOSIS — Z8249 Family history of ischemic heart disease and other diseases of the circulatory system: Secondary | ICD-10-CM | POA: Diagnosis not present

## 2018-07-09 DIAGNOSIS — Z7951 Long term (current) use of inhaled steroids: Secondary | ICD-10-CM | POA: Diagnosis not present

## 2018-07-09 DIAGNOSIS — R112 Nausea with vomiting, unspecified: Secondary | ICD-10-CM

## 2018-07-09 DIAGNOSIS — Z6838 Body mass index (BMI) 38.0-38.9, adult: Secondary | ICD-10-CM

## 2018-07-09 DIAGNOSIS — G4733 Obstructive sleep apnea (adult) (pediatric): Secondary | ICD-10-CM | POA: Diagnosis present

## 2018-07-09 DIAGNOSIS — Z9049 Acquired absence of other specified parts of digestive tract: Secondary | ICD-10-CM | POA: Diagnosis not present

## 2018-07-09 DIAGNOSIS — R160 Hepatomegaly, not elsewhere classified: Secondary | ICD-10-CM | POA: Diagnosis present

## 2018-07-09 DIAGNOSIS — Z4659 Encounter for fitting and adjustment of other gastrointestinal appliance and device: Secondary | ICD-10-CM

## 2018-07-09 DIAGNOSIS — R1010 Upper abdominal pain, unspecified: Secondary | ICD-10-CM

## 2018-07-09 DIAGNOSIS — Z79899 Other long term (current) drug therapy: Secondary | ICD-10-CM

## 2018-07-09 DIAGNOSIS — K55069 Acute infarction of intestine, part and extent unspecified: Secondary | ICD-10-CM | POA: Diagnosis present

## 2018-07-09 DIAGNOSIS — K562 Volvulus: Secondary | ICD-10-CM | POA: Diagnosis present

## 2018-07-09 DIAGNOSIS — F5101 Primary insomnia: Secondary | ICD-10-CM | POA: Diagnosis present

## 2018-07-09 DIAGNOSIS — K311 Adult hypertrophic pyloric stenosis: Secondary | ICD-10-CM | POA: Diagnosis present

## 2018-07-09 DIAGNOSIS — K440 Diaphragmatic hernia with obstruction, without gangrene: Secondary | ICD-10-CM | POA: Diagnosis present

## 2018-07-09 DIAGNOSIS — K219 Gastro-esophageal reflux disease without esophagitis: Secondary | ICD-10-CM | POA: Diagnosis present

## 2018-07-09 DIAGNOSIS — K449 Diaphragmatic hernia without obstruction or gangrene: Secondary | ICD-10-CM

## 2018-07-09 DIAGNOSIS — J310 Chronic rhinitis: Secondary | ICD-10-CM | POA: Diagnosis present

## 2018-07-09 DIAGNOSIS — Z8719 Personal history of other diseases of the digestive system: Secondary | ICD-10-CM

## 2018-07-09 DIAGNOSIS — Z1159 Encounter for screening for other viral diseases: Secondary | ICD-10-CM | POA: Diagnosis not present

## 2018-07-09 DIAGNOSIS — R32 Unspecified urinary incontinence: Secondary | ICD-10-CM | POA: Insufficient documentation

## 2018-07-09 DIAGNOSIS — K432 Incisional hernia without obstruction or gangrene: Secondary | ICD-10-CM | POA: Diagnosis present

## 2018-07-09 LAB — URINALYSIS, ROUTINE W REFLEX MICROSCOPIC
Bilirubin Urine: NEGATIVE
Glucose, UA: NEGATIVE mg/dL
Hgb urine dipstick: NEGATIVE
Ketones, ur: 20 mg/dL — AB
Leukocytes,Ua: NEGATIVE
Nitrite: NEGATIVE
Protein, ur: NEGATIVE mg/dL
Specific Gravity, Urine: >1.046 — ABNORMAL HIGH (ref 1.005–1.030)
pH: 6 (ref 5.0–8.0)

## 2018-07-09 LAB — COMPREHENSIVE METABOLIC PANEL
ALT: 22 U/L (ref 0–44)
AST: 21 U/L (ref 15–41)
Albumin: 4.4 g/dL (ref 3.5–5.0)
Alkaline Phosphatase: 88 U/L (ref 38–126)
Anion gap: 11 (ref 5–15)
BUN: 11 mg/dL (ref 6–20)
CO2: 24 mmol/L (ref 22–32)
Calcium: 9.5 mg/dL (ref 8.9–10.3)
Chloride: 104 mmol/L (ref 98–111)
Creatinine, Ser: 0.62 mg/dL (ref 0.44–1.00)
GFR calc Af Amer: >60 mL/min (ref >60)
GFR calc non Af Amer: >60 mL/min (ref >60)
Glucose, Bld: 123 mg/dL — ABNORMAL HIGH (ref 70–99)
Potassium: 4.1 mmol/L (ref 3.5–5.1)
Sodium: 139 mmol/L (ref 135–145)
Total Bilirubin: 0.5 mg/dL (ref 0.3–1.2)
Total Protein: 8.1 g/dL (ref 6.5–8.1)

## 2018-07-09 LAB — CBC
HCT: 40.2 % (ref 36.0–46.0)
Hemoglobin: 13 g/dL (ref 12.0–15.0)
MCH: 27.5 pg (ref 26.0–34.0)
MCHC: 32.3 g/dL (ref 30.0–36.0)
MCV: 85.2 fL (ref 80.0–100.0)
Platelets: 305 10*3/uL (ref 150–400)
RBC: 4.72 MIL/uL (ref 3.87–5.11)
RDW: 13.3 % (ref 11.5–15.5)
WBC: 9.7 10*3/uL (ref 4.0–10.5)
nRBC: 0 % (ref 0.0–0.2)

## 2018-07-09 LAB — PHOSPHORUS: Phosphorus: 3.4 mg/dL (ref 2.5–4.6)

## 2018-07-09 LAB — LIPASE, BLOOD: Lipase: 33 U/L (ref 11–51)

## 2018-07-09 LAB — SARS CORONAVIRUS 2 BY RT PCR (HOSPITAL ORDER, PERFORMED IN ~~LOC~~ HOSPITAL LAB): SARS Coronavirus 2: NEGATIVE

## 2018-07-09 LAB — MAGNESIUM: Magnesium: 2.2 mg/dL (ref 1.7–2.4)

## 2018-07-09 MED ORDER — METHOCARBAMOL 1000 MG/10ML IJ SOLN
1000.0000 mg | Freq: Four times a day (QID) | INTRAVENOUS | Status: DC | PRN
  Filled 2018-07-09: qty 10

## 2018-07-09 MED ORDER — PROCHLORPERAZINE EDISYLATE 10 MG/2ML IJ SOLN
5.0000 mg | Freq: Four times a day (QID) | INTRAMUSCULAR | Status: DC | PRN
  Administered 2018-07-11: 10:00:00 10 mg via INTRAVENOUS
  Filled 2018-07-09: qty 2

## 2018-07-09 MED ORDER — HYDROCORTISONE 1 % EX CREA: 1.0000 "application " | TOPICAL_CREAM | Freq: Three times a day (TID) | CUTANEOUS | Status: DC | PRN

## 2018-07-09 MED ORDER — BISACODYL 10 MG RE SUPP: 10.0000 mg | Freq: Every day | RECTAL | Status: DC | PRN

## 2018-07-09 MED ORDER — MORPHINE SULFATE (PF) 4 MG/ML IV SOLN
4.0000 mg | Freq: Once | INTRAVENOUS | Status: AC
  Administered 2018-07-09: 20:00:00 4 mg via INTRAVENOUS
  Filled 2018-07-09: qty 1

## 2018-07-09 MED ORDER — ALUM & MAG HYDROXIDE-SIMETH 200-200-20 MG/5ML PO SUSP: 30.0000 mL | Freq: Four times a day (QID) | ORAL | Status: DC | PRN

## 2018-07-09 MED ORDER — METOCLOPRAMIDE HCL 5 MG/ML IJ SOLN
10.0000 mg | Freq: Once | INTRAMUSCULAR | Status: AC
  Administered 2018-07-09: 22:00:00 10 mg via INTRAVENOUS
  Filled 2018-07-09: qty 2

## 2018-07-09 MED ORDER — HYDROMORPHONE HCL 1 MG/ML IJ SOLN
0.5000 mg | INTRAMUSCULAR | Status: DC | PRN
  Administered 2018-07-10: 02:00:00 1 mg via INTRAVENOUS
  Administered 2018-07-10: 21:00:00 0.5 mg via INTRAVENOUS
  Administered 2018-07-11: 23:00:00 1 mg via INTRAVENOUS
  Administered 2018-07-11: 0.5 mg via INTRAVENOUS
  Administered 2018-07-11: 1 mg via INTRAVENOUS
  Administered 2018-07-11: 0.5 mg via INTRAVENOUS
  Filled 2018-07-09 (×7): qty 1

## 2018-07-09 MED ORDER — HYDROMORPHONE HCL 1 MG/ML IJ SOLN: 0.5000 mg | INTRAMUSCULAR | Status: DC | PRN

## 2018-07-09 MED ORDER — METOCLOPRAMIDE HCL 5 MG/ML IJ SOLN: 5.0000 mg | Freq: Three times a day (TID) | INTRAMUSCULAR | Status: DC | PRN

## 2018-07-09 MED ORDER — GUAIFENESIN-DM 100-10 MG/5ML PO SYRP: 10.0000 mL | ORAL_SOLUTION | ORAL | Status: DC | PRN

## 2018-07-09 MED ORDER — ENALAPRILAT 1.25 MG/ML IV SOLN
0.6250 mg | Freq: Four times a day (QID) | INTRAVENOUS | Status: DC | PRN
  Filled 2018-07-09: qty 1

## 2018-07-09 MED ORDER — LACTATED RINGERS IV BOLUS: 1000.0000 mL | Freq: Three times a day (TID) | INTRAVENOUS | Status: AC | PRN

## 2018-07-09 MED ORDER — SIMETHICONE 80 MG PO CHEW
40.0000 mg | CHEWABLE_TABLET | Freq: Four times a day (QID) | ORAL | Status: DC | PRN
  Filled 2018-07-09: qty 1

## 2018-07-09 MED ORDER — MORPHINE SULFATE (PF) 4 MG/ML IV SOLN
4.0000 mg | Freq: Once | INTRAVENOUS | Status: AC
  Administered 2018-07-09: 18:00:00 4 mg via INTRAVENOUS
  Filled 2018-07-09: qty 1

## 2018-07-09 MED ORDER — LACTATED RINGERS IV SOLN
INTRAVENOUS | Status: DC
  Administered 2018-07-10 – 2018-07-11 (×3): via INTRAVENOUS

## 2018-07-09 MED ORDER — HYDRALAZINE HCL 20 MG/ML IJ SOLN: 10.0000 mg | INTRAMUSCULAR | Status: DC | PRN

## 2018-07-09 MED ORDER — MENTHOL 3 MG MT LOZG
1.0000 | LOZENGE | OROMUCOSAL | Status: DC | PRN
  Administered 2018-07-10: 10:00:00 3 mg via ORAL
  Filled 2018-07-09: qty 9

## 2018-07-09 MED ORDER — ONDANSETRON HCL 4 MG/2ML IJ SOLN
4.0000 mg | Freq: Once | INTRAMUSCULAR | Status: AC
  Administered 2018-07-09: 20:00:00 4 mg via INTRAVENOUS
  Filled 2018-07-09: qty 2

## 2018-07-09 MED ORDER — ONDANSETRON 4 MG PO TBDP: 4.0000 mg | ORAL_TABLET | Freq: Four times a day (QID) | ORAL | Status: DC | PRN

## 2018-07-09 MED ORDER — PROCHLORPERAZINE MALEATE 10 MG PO TABS
10.0000 mg | ORAL_TABLET | Freq: Four times a day (QID) | ORAL | Status: DC | PRN
  Filled 2018-07-09: qty 1

## 2018-07-09 MED ORDER — FLUTICASONE PROPIONATE 50 MCG/ACT NA SUSP
1.0000 | Freq: Every day | NASAL | Status: DC
  Administered 2018-07-11 – 2018-07-13 (×3): 1 via NASAL
  Filled 2018-07-09: qty 16

## 2018-07-09 MED ORDER — ONDANSETRON HCL 4 MG/2ML IJ SOLN: 4.0000 mg | Freq: Four times a day (QID) | INTRAMUSCULAR | Status: DC | PRN

## 2018-07-09 MED ORDER — DIPHENHYDRAMINE HCL 50 MG/ML IJ SOLN
12.5000 mg | Freq: Four times a day (QID) | INTRAMUSCULAR | Status: DC | PRN
  Administered 2018-07-10: 25 mg via INTRAVENOUS
  Filled 2018-07-09: qty 1

## 2018-07-09 MED ORDER — MORPHINE SULFATE (PF) 4 MG/ML IV SOLN
4.0000 mg | Freq: Once | INTRAVENOUS | Status: AC
  Administered 2018-07-09: 22:00:00 4 mg via INTRAVENOUS
  Filled 2018-07-09: qty 1

## 2018-07-09 MED ORDER — IOHEXOL 300 MG/ML  SOLN
100.0000 mL | Freq: Once | INTRAMUSCULAR | Status: AC | PRN
  Administered 2018-07-09: 19:00:00 100 mL via INTRAVENOUS

## 2018-07-09 MED ORDER — SODIUM CHLORIDE 0.9 % IV SOLN
8.0000 mg | Freq: Four times a day (QID) | INTRAVENOUS | Status: DC | PRN
  Filled 2018-07-09: qty 4

## 2018-07-09 MED ORDER — SODIUM CHLORIDE 0.9 % IV SOLN: Freq: Three times a day (TID) | INTRAVENOUS | Status: DC | PRN

## 2018-07-09 MED ORDER — SODIUM CHLORIDE 0.9 % IV BOLUS: 1000.0000 mL | Freq: Once | INTRAVENOUS | Status: DC

## 2018-07-09 MED ORDER — LACTATED RINGERS IV BOLUS
1000.0000 mL | Freq: Once | INTRAVENOUS | Status: AC
  Administered 2018-07-09: 1000 mL via INTRAVENOUS

## 2018-07-09 MED ORDER — ONDANSETRON HCL 4 MG/2ML IJ SOLN
4.0000 mg | Freq: Once | INTRAMUSCULAR | Status: AC
  Administered 2018-07-09: 18:00:00 4 mg via INTRAVENOUS
  Filled 2018-07-09: qty 2

## 2018-07-09 MED ORDER — METOPROLOL TARTRATE 5 MG/5ML IV SOLN: 5.0000 mg | Freq: Four times a day (QID) | INTRAVENOUS | Status: DC | PRN

## 2018-07-09 MED ORDER — LIP MEDEX EX OINT
1.0000 "application " | TOPICAL_OINTMENT | Freq: Two times a day (BID) | CUTANEOUS | Status: DC
  Administered 2018-07-10 – 2018-07-13 (×7): 1 via TOPICAL
  Filled 2018-07-09 (×2): qty 7

## 2018-07-09 MED ORDER — ENOXAPARIN SODIUM 40 MG/0.4ML ~~LOC~~ SOLN
40.0000 mg | Freq: Every day | SUBCUTANEOUS | Status: DC
  Administered 2018-07-10 – 2018-07-11 (×3): 40 mg via SUBCUTANEOUS
  Filled 2018-07-09 (×3): qty 0.4

## 2018-07-09 MED ORDER — SODIUM CHLORIDE (PF) 0.9 % IJ SOLN
INTRAMUSCULAR | Status: AC
  Administered 2018-07-09: 22:00:00 10 mL
  Filled 2018-07-09: qty 50

## 2018-07-09 MED ORDER — PHENOL 1.4 % MT LIQD
1.0000 | OROMUCOSAL | Status: DC | PRN
  Administered 2018-07-10: 20:00:00 2 via OROMUCOSAL
  Filled 2018-07-09: qty 177

## 2018-07-09 MED ORDER — IOHEXOL 300 MG/ML  SOLN
30.0000 mL | Freq: Once | INTRAMUSCULAR | Status: AC | PRN
  Administered 2018-07-09: 19:00:00 30 mL via ORAL

## 2018-07-09 MED ORDER — LACTATED RINGERS IV BOLUS
1000.0000 mL | Freq: Once | INTRAVENOUS | Status: AC
  Administered 2018-07-10: 01:00:00 1000 mL via INTRAVENOUS

## 2018-07-09 MED ORDER — ACETAMINOPHEN 650 MG RE SUPP: 650.0000 mg | Freq: Four times a day (QID) | RECTAL | Status: DC | PRN

## 2018-07-09 MED ORDER — LACTATED RINGERS IV BOLUS: 1000.0000 mL | Freq: Three times a day (TID) | INTRAVENOUS | Status: DC | PRN

## 2018-07-09 MED ORDER — LORAZEPAM 2 MG/ML IJ SOLN
0.5000 mg | Freq: Three times a day (TID) | INTRAMUSCULAR | Status: DC | PRN
  Filled 2018-07-09: qty 1

## 2018-07-09 MED ORDER — ONDANSETRON HCL 4 MG/2ML IJ SOLN
4.0000 mg | Freq: Four times a day (QID) | INTRAMUSCULAR | Status: DC | PRN
  Administered 2018-07-10 – 2018-07-11 (×4): 4 mg via INTRAVENOUS
  Filled 2018-07-09 (×4): qty 2

## 2018-07-09 MED ORDER — HYDROCORTISONE (PERIANAL) 2.5 % EX CREA: 1.0000 "application " | TOPICAL_CREAM | Freq: Four times a day (QID) | CUTANEOUS | Status: DC | PRN

## 2018-07-09 MED ORDER — MAGIC MOUTHWASH
15.0000 mL | Freq: Four times a day (QID) | ORAL | Status: DC | PRN
  Filled 2018-07-09: qty 15

## 2018-07-09 NOTE — ED Provider Notes (Signed)
[facility] DEPT Provider Note   CSN: [id number] Arrival date & time: 07/09/18  1640    History   Chief Complaint Chief Complaint  Patient presents with   Abdominal Pain   vomit    HPI Theresa Gilbert is a 53 y.o. female.     HPI    53 year old female with a history of hypertension and hiatal hernia, with history of admission from 5/12-5/15 with concern for apparent intermittent obstruction caused by hiatal hernia which improved with IV fluids and NG tube, who presents with return of symptoms of abdominal pain, nausea and vomiting.  Patient reports that she woke up this morning with epigastric pain which was severe, sharp, 8 out of 10.  Reports sensation of distention and frequent burping.  Denies passing flatus.  Reports that she had a small bowel movement this morning prior to symptoms beginning, but is not had any other bowel movements or passed flatus today.  She has had about 5-6 episodes of vomiting, and dry heaves since she has been in the emergency department.  Symptoms remind her of what she had had in May, and notes that it was difficult to diagnose, with initial CT scan appearing normal, repeat imaging concerning for obstruction.  Past Medical History:  Diagnosis Date   GERD (gastroesophageal reflux disease)    Hypertension    Sleep apnea     Patient Active Problem List   Diagnosis Date Noted   Urinary incontinence 07/09/2018   Incarcerated hiatal hernia 07/09/2018   Hiatal hernia with obstruction but no gangrene 06/01/2018   OSA (obstructive sleep apnea) 11/03/2017   Polyp of colon 09/25/2017   Chronic rhinitis 06/04/2015   Snoring 05/14/2015   Morbid obesity ([place]) 05/25/2014   Benign essential hypertension 07/16/2013   GERD (gastroesophageal reflux disease) 07/16/2013   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   Incisional hernia 07/16/2013   Insomnia 07/16/2013   Primary insomnia 07/16/2013   Perennial allergic  rhinitis with seasonal variation 07/16/2013   Palpitations 07/16/2013   Ventral hernia 07/16/2013    Past Surgical History:  Procedure Laterality Date   arthritic cyst Left    wrist   CHOLECYSTECTOMY     GANGLION CYST EXCISION Left    wrist,   TONSILLECTOMY     TURBINATE REDUCTION  11/20/2017   wisdom teeth extaction       OB History   No obstetric history on file.      Home Medications    Prior to Admission medications   Medication Sig Start Date End Date Taking? Authorizing Provider  acetaminophen (TYLENOL) 325 MG tablet Take 2 tablets (650 mg total) by mouth every 6 (six) hours as needed for mild pain, fever or headache. 06/04/18  Yes Rayburn, Claiborne Billings A, PA-C  Calcium Citrate-Vitamin D (CALCIUM + D PO) Take 1 tablet by mouth daily.    Yes [provider]  calcium-vitamin D (OSCAL WITH D) 500-200 MG-UNIT tablet Take 1 tablet by mouth daily.   Yes [provider]  cetirizine (ZYRTEC) 5 MG tablet Take 5 mg by mouth daily.   Yes [provider]  Cyanocobalamin (VITAMIN B-12 PO) Take 1 tablet by mouth daily.    Yes [provider]  esomeprazole (NEXIUM) 20 MG capsule Take 20 mg by mouth 2 (two) times daily.   Yes [provider]  fluticasone (FLONASE) 50 MCG/ACT nasal spray Place 1 spray into both nostrils daily.   Yes [provider]  ibuprofen (ADVIL) 200 MG tablet Take 200 mg  by mouth daily as needed for headache or moderate pain.   Yes [provider]  losartan (COZAAR) 100 MG tablet Take 100 mg by mouth at bedtime.  05/28/18  Yes [provider]  montelukast (SINGULAIR) 10 MG tablet Take 10 mg by mouth at bedtime.   Yes [provider]  Multiple Vitamins-Minerals (MULTIVITAMIN GUMMIES ADULTS PO) Take 1 tablet by mouth daily.    Yes [provider]  ondansetron (ZOFRAN ODT) 4 MG disintegrating tablet Take 1 tablet (4 mg total) by mouth every 8 (eight) hours as needed for nausea or  vomiting. Patient not taking: Reported on 07/09/2018 05/31/18   Henderly, Britni A, PA-C  sucralfate (CARAFATE) 1 g tablet Take 1 tablet (1 g total) by mouth 4 (four) times daily -  with meals and at bedtime for 7 days. Patient not taking: Reported on 07/09/2018 05/31/18 06/07/18  Henderly, Britni A, PA-C    Family History Family History  Problem Relation Age of Onset   Cancer Mother    Diabetes Mother    Heart disease Father    Cancer Sister    Heart disease Maternal Grandmother    Heart disease Maternal Grandfather     Social History Social History   Tobacco Use   Smoking status: Never Smoker   Smokeless tobacco: Never Used  Substance Use Topics   Alcohol use: Yes    Alcohol/week: 2.0 standard drinks    Types: 2 Glasses of wine per week    Comment: soc   Drug use: No     Allergies   Patient has no known allergies.   Review of Systems Review of Systems   Physical Exam Updated Vital Signs BP (!) 160/98    Pulse 93    Temp 99.4 F (37.4 C) (Oral)    Resp 16    LMP  (LMP Unknown)    SpO2 97%   Physical Exam Vitals signs and nursing note reviewed.  Constitutional:      General: She is not in acute distress.    Appearance: She is well-developed. She is not diaphoretic.  HENT:     Head: Normocephalic and atraumatic.  Eyes:     Conjunctiva/sclera: Conjunctivae normal.  Neck:     Musculoskeletal: Normal range of motion.  Cardiovascular:     Rate and Rhythm: Normal rate and regular rhythm.  Pulmonary:     Effort: Pulmonary effort is normal. No respiratory distress.  Abdominal:     General: There is no distension.     Palpations: Abdomen is soft.     Tenderness: There is abdominal tenderness in the epigastric area. There is no guarding.  Musculoskeletal:        General: No tenderness.  Skin:    General: Skin is warm and dry.     Findings: No erythema or rash.  Neurological:     Mental Status: She is alert and oriented to person, place, and time.       ED Treatments / Results  Labs (all labs ordered are listed, but only abnormal results are displayed) Labs Reviewed  COMPREHENSIVE METABOLIC PANEL - Abnormal; Notable for the following components:      Result Value   Glucose, Bld 123 (*)    All other components within normal limits  URINALYSIS, ROUTINE W REFLEX MICROSCOPIC - Abnormal; Notable for the following components:   Specific Gravity, Urine >1.046 (*)    Ketones, ur 20 (*)    All other components within normal limits  SARS CORONAVIRUS  2 (HOSPITAL ORDER, [facility])  LIPASE, BLOOD  CBC  MAGNESIUM  PHOSPHORUS  HEMOGLOBIN W3U  BASIC METABOLIC PANEL  CBC  PREALBUMIN  LIPASE, BLOOD    EKG EKG Interpretation  Date/Time:  Friday July 09 2018 22:30:28 EDT Ventricular Rate:  101 PR Interval:    QRS Duration: 84 QT Interval:  367 QTC Calculation: 476 R Axis:   32 Text Interpretation:  Sinus tachycardia Borderline prolonged PR interval Low voltage, precordial leads No significant change since last tracing Confirmed by Gareth Morgan [phone]) on 07/09/2018 11:57:08 PM   Radiology Ct Abdomen Pelvis W Contrast  Result Date: 07/09/2018 CLINICAL DATA:  Abdominal pain, nausea vomiting. EXAM: CT ABDOMEN AND PELVIS WITH CONTRAST TECHNIQUE: Multidetector CT imaging of the abdomen and pelvis was performed using the standard protocol following bolus administration of intravenous contrast. CONTRAST:  51mL OMNIPAQUE IOHEXOL 300 MG/ML SOLN, 139mL OMNIPAQUE IOHEXOL 300 MG/ML SOLN COMPARISON:  None. FINDINGS: Lower chest: Large hiatal hernia. Hepatobiliary: Large right hepatic mass is stable from 2011, thought to represent a hemangioma. Status post cholecystectomy. No biliary dilatation. Pancreas: Unremarkable. No pancreatic ductal dilatation or surrounding inflammatory changes. Spleen: Normal in size without focal abnormality. Adrenals/Urinary Tract: Adrenal glands are unremarkable. Kidneys are normal, without  renal calculi, focal lesion, or hydronephrosis. Bladder is unremarkable. Stomach/Bowel: Stomach is within normal limits. Appendix appears normal. No evidence of bowel wall thickening, distention, or inflammatory changes. Vascular/Lymphatic: No significant vascular findings are present. No enlarged abdominal or pelvic lymph nodes. Reproductive: Uterus and bilateral adnexa are unremarkable. Other: Wide neck fat containing supraumbilical midline anterior abdominal wall hernia. A second smaller fat containing periumbilical anterior abdominal wall hernia. Musculoskeletal: No acute or significant osseous findings. IMPRESSION: 1. Large hiatal hernia. 2. Stable large right hepatic mass, thought to represent a hemangioma. 3. Two fat containing supraumbilical and periumbilical midline anterior abdominal wall hernias. Electronically Signed   By: Fidela Salisbury M.D.   On: 07/09/2018 19:40    Procedures Procedures (including critical care time)  Medications Ordered in ED Medications  fluticasone (FLONASE) 50 MCG/ACT nasal spray 1 spray (has no administration in time range)  enoxaparin (LOVENOX) injection 40 mg (has no administration in time range)  0.9 %  sodium chloride infusion (has no administration in time range)  lactated ringers infusion (has no administration in time range)  bisacodyl (DULCOLAX) suppository 10 mg (has no administration in time range)  prochlorperazine (COMPAZINE) tablet 10 mg (has no administration in time range)    Or  prochlorperazine (COMPAZINE) injection 5-10 mg (has no administration in time range)  simethicone (MYLICON) chewable tablet 40 mg (has no administration in time range)  metoprolol tartrate (LOPRESSOR) injection 5 mg (has no administration in time range)  hydrALAZINE (APRESOLINE) injection 10 mg (has no administration in time range)  lactated ringers bolus 1,000 mL (has no administration in time range)  lactated ringers bolus 1,000 mL (has no administration in time  range)  HYDROmorphone (DILAUDID) injection 0.5-2 mg (has no administration in time range)  methocarbamol (ROBAXIN) 1,000 mg in dextrose 5 % 50 mL IVPB (has no administration in time range)  acetaminophen (TYLENOL) suppository 650 mg (has no administration in time range)  ondansetron (ZOFRAN) injection 4 mg (has no administration in time range)    Or  ondansetron (ZOFRAN) 8 mg in sodium chloride 0.9 % 50 mL IVPB (has no administration in time range)  metoCLOPramide (REGLAN) injection 5-10 mg (has no administration in time range)  lip balm (CARMEX) ointment 1 application (  has no administration in time range)  magic mouthwash (has no administration in time range)  diphenhydrAMINE (BENADRYL) injection 12.5-25 mg (has no administration in time range)  guaiFENesin-dextromethorphan (ROBITUSSIN DM) 100-10 MG/5ML syrup 10 mL (has no administration in time range)  hydrocortisone (ANUSOL-HC) 2.5 % rectal cream 1 application (has no administration in time range)  alum & mag hydroxide-simeth (MAALOX/MYLANTA) 200-200-20 MG/5ML suspension 30 mL (has no administration in time range)  hydrocortisone cream 1 % 1 application (has no administration in time range)  menthol-cetylpyridinium (CEPACOL) lozenge 3 mg (has no administration in time range)  phenol (CHLORASEPTIC) mouth spray 1-2 spray (has no administration in time range)  enalaprilat (VASOTEC) injection 0.625-1.25 mg (has no administration in time range)  LORazepam (ATIVAN) injection 0.5-1 mg (has no administration in time range)  morphine 4 MG/ML injection 4 mg (4 mg Intravenous Given 07/09/18 1755)  ondansetron (ZOFRAN) injection 4 mg (4 mg Intravenous Given 07/09/18 1752)  iohexol (OMNIPAQUE) 300 MG/ML solution 100 mL (100 mLs Intravenous Contrast Given 07/09/18 1842)  sodium chloride (PF) 0.9 % injection (10 mLs  Given 07/09/18 2146)  iohexol (OMNIPAQUE) 300 MG/ML solution 30 mL (30 mLs Oral Contrast Given 07/09/18 1842)  morphine 4 MG/ML injection 4 mg  (4 mg Intravenous Given 07/09/18 1945)  ondansetron (ZOFRAN) injection 4 mg (4 mg Intravenous Given 07/09/18 1945)  lactated ringers bolus 1,000 mL (1,000 mLs Intravenous New Bag/Given 07/09/18 2142)  metoCLOPramide (REGLAN) injection 10 mg (10 mg Intravenous Given 07/09/18 2139)  morphine 4 MG/ML injection 4 mg (4 mg Intravenous Given 07/09/18 2139)     Initial Impression / Assessment and Plan / ED Course  I have reviewed the triage vital signs and the nursing notes.  Pertinent labs & imaging results that were available during my care of the patient were reviewed by me and considered in my medical decision making (see chart for details).       53 year old female with a history of hypertension and hiatal hernia, with history of admission from 5/12-5/15 with concern for apparent intermittent obstruction caused by hiatal hernia which improved with IV fluids and NG tube, who presents with return of symptoms of abdominal pain, nausea and vomiting.  Urinalysis shows no sign of urinary tract infection.  No signs of pancreatitis or hepatitis. Doubt cardiac etiology given epigastric tenderness present, history of same symptoms prior with normal troponin and findings concerning for hiatal hernia obstruction.  CT shows a large hiatal hernia, stable right hepatic mass thought to represent hemangioma, and 2 fat-containing supraumbilical and periumbilical midline hernias.  Given her history of the same symptoms with prior hiatal hernia with obstruction, with initially negative CT imaging, will consult surgery for evaluation. Dr. Johney Maine evaluated and will admit for further care.   Final Clinical Impressions(s) / ED Diagnoses   Final diagnoses:  Nausea and vomiting, intractability of vomiting not specified, unspecified vomiting type  Pain of upper abdomen  Hiatal hernia    ED Discharge Orders    None       Gareth Morgan, MD 07/09/18 2359

## 2018-07-09 NOTE — ED Triage Notes (Signed)
Pt reports that she was here month or so ago here for hernia that got twisted. During night starting to have abd pains with belching, nausea and vomiting.

## 2018-07-09 NOTE — H&P (Signed)
Theresa Gilbert  14-Jan-1966 [id number]  CARE TEAM:  PCP: Wilburn Mylar, MD  Outpatient Care Team: Patient Care Team: Wilburn Mylar, MD as PCP - General (Family Medicine) Wilburn Mylar, MD (Family Medicine)  Inpatient Treatment Team: Treatment Team: Attending Provider: Alvira Monday, MD; Technician: Stephannie Li, EMT; Registered Nurse: Alfred Levins, RN; Consulting Physician: Montez Morita, Md, MD   This patient is a 53 y.o.female who presents today for surgical evaluation at the request of Dr. Dalene Seltzer , [facility] ED  Chief complaint / Reason for evaluation: Recurrent nausea vomiting and chest pain with incarcerated hiatal hernia  Pleasant obese woman who has had a hiatal hernia for quite some time.  Developed sharp chest pain and nausea vomiting and was admitted last month.  Nasogastric tube placed.  Had relief of symptoms.  Tolerated nasogastric clamping trial was advanced on diet.  No evidence of bowel obstruction or any other major intra-abdominal pathology.  Presumption of etiology nausea vomiting related to her moderate sized hiatal hernia.  Held off on emergent hiatal hernia surgery.  Able to be discharged home on soft diet without recurrent symptoms.  Followed up with Dr. Fredricka Bonine with our office 2 weeks ago and seemed to be doing better.  Plan was for the patient to try and lose some weight to have a better long-term success rate with any hiatal hernia repair.  Unfortunately she has had recurrent nausea vomiting and abdominal and chest pain this week.  Had a more severe episode today.  Pain became unbearable.  N/V.  Could not keep anything down.  Was concerned.  Came to the emergency room Friday night.  CT scan confirms hiatal hernia with about half of the stomach up in the chest.  Some mild stomach wall thickening.  No other major intra-abdominal pathologies.  Liver mass stable from 2011with history of presumed hemangioma.  She has a history of cholecystectomy in the past  with resulting periumbilical incisional hernia   Assessment  Theresa Gilbert  53 y.o. female       Problem List:  Principal Problem:   Hiatal hernia with obstruction but no gangrene Active Problems:   Chronic rhinitis   GERD (gastroesophageal reflux disease)   Incisional hernia   Morbid obesity (HCC)   OSA (obstructive sleep apnea)   Primary insomnia   Recurrent nausea and vomiting with chronic hiatal hernia.  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  Plan:  Admit  IV fluids.  Nausea control.  Pain control.  Nasogastric tube decompression.  I am concerned that she is failing nonoperative management and she will require surgical intervention this admission.  Hiatal hernia reduction with closure.  Long-term outcomes would be best if she had Roux-en-Y gastric reconstruction.  However that may not be an option if her for stomach is too inflamed and may have to proceed with just hiatal closure with fundoplication.  Intraoperative EGD a possibility.  Ideally this case is not done on the weekends with emergencies, but we will see how she responds to treatment and operative team availability.  -VTE prophylaxis- SCDs, etc -mobilize as tolerated to help recovery  40 minutes spent in review, evaluation, examination, counseling, and coordination of care.  More than 50% of that time was spent in counseling.  Ardeth Sportsman, MD, FACS, MASCRS Gastrointestinal and Minimally Invasive Surgery    [address], [address] [place], [place] [address] [phone] Main / Paging [phone] Fax   07/09/2018  Past Medical History:  Diagnosis Date   GERD (gastroesophageal reflux disease)    Hypertension    Sleep apnea     Past Surgical History:  Procedure Laterality Date   arthritic cyst Left    wrist   CHOLECYSTECTOMY     GANGLION CYST EXCISION Left    wrist,   TONSILLECTOMY     TURBINATE REDUCTION  11/20/2017    wisdom teeth extaction      Social History   Socioeconomic History   Marital status: Married    Spouse name: Not on file   Number of children: 1   Years of education: 12   Highest education level: High school graduate  Occupational History   Occupation: Medical sales representative: Manufacturing engineer  Social Network engineer strain: Not on file   Food insecurity    Worry: Not on file    Inability: Not on file   Transportation needs    Medical: Not on file    Non-medical: Not on file  Tobacco Use   Smoking status: Never Smoker   Smokeless tobacco: Never Used  Substance and Sexual Activity   Alcohol use: Yes    Alcohol/week: 2.0 standard drinks    Types: 2 Glasses of wine per week    Comment: soc   Drug use: No   Sexual activity: Yes    Birth control/protection: None  Lifestyle   Physical activity    Days per week: 4 days    Minutes per session: 40 min   Stress: Not on file  Relationships   Social connections    Talks on phone: Once a week    Gets together: More than three times a week    Attends religious service: More than 4 times per year    Active member of club or organization: No    Attends meetings of clubs or organizations: Never    Relationship status: Married   Intimate partner violence    Fear of current or ex partner: No    Emotionally abused: No    Physically abused: No    Forced sexual activity: No  Other Topics Concern   Not on file  Social History Narrative   Not on file    Family History  Problem Relation Age of Onset   Cancer Mother    Diabetes Mother    Heart disease Father    Cancer Sister    Heart disease Maternal Grandmother    Heart disease Maternal Grandfather     Current Facility-Administered Medications  Medication Dose Route Frequency Provider Last Rate Last Dose   lactated ringers bolus 1,000 mL  1,000 mL Intravenous Once Karie Soda, MD       lactated ringers bolus 1,000 mL  1,000 mL  Intravenous Q8H PRN Alyaan Budzynski, Viviann Spare, MD       metoCLOPramide (REGLAN) injection 10 mg  10 mg Intravenous Once Alvira Monday, MD       morphine 4 MG/ML injection 4 mg  4 mg Intravenous Once Alvira Monday, MD       sodium chloride (PF) 0.9 % injection            sodium chloride 0.9 % bolus 1,000 mL  1,000 mL Intravenous Once Alvira Monday, MD       Current Outpatient Medications  Medication Sig Dispense Refill   acetaminophen (TYLENOL) 325 MG tablet Take 2 tablets (650 mg total) by mouth every 6 (six) hours as needed for mild pain, fever or  headache.     Calcium Citrate-Vitamin D (CALCIUM + D PO) Take 1 tablet by mouth daily.      calcium-vitamin D (OSCAL WITH D) 500-200 MG-UNIT tablet Take 1 tablet by mouth daily.     cetirizine (ZYRTEC) 5 MG tablet Take 5 mg by mouth daily.     Cyanocobalamin (VITAMIN B-12 PO) Take 1 tablet by mouth daily.      esomeprazole (NEXIUM) 20 MG capsule Take 20 mg by mouth 2 (two) times daily.     fluticasone (FLONASE) 50 MCG/ACT nasal spray Place 1 spray into both nostrils daily.     ibuprofen (ADVIL) 200 MG tablet Take 200 mg by mouth daily as needed for headache or moderate pain.     losartan (COZAAR) 100 MG tablet Take 100 mg by mouth at bedtime.      montelukast (SINGULAIR) 10 MG tablet Take 10 mg by mouth at bedtime.     Multiple Vitamins-Minerals (MULTIVITAMIN GUMMIES ADULTS PO) Take 1 tablet by mouth daily.      ondansetron (ZOFRAN ODT) 4 MG disintegrating tablet Take 1 tablet (4 mg total) by mouth every 8 (eight) hours as needed for nausea or vomiting. (Patient not taking: Reported on 07/09/2018) 20 tablet 0   sucralfate (CARAFATE) 1 g tablet Take 1 tablet (1 g total) by mouth 4 (four) times daily -  with meals and at bedtime for 7 days. (Patient not taking: Reported on 07/09/2018) 28 tablet 0     No Known Allergies  ROS:   All other systems reviewed & are negative except per HPI or as noted below: Constitutional:  No fevers, chills,  sweats.  Weight stable Eyes:  No vision changes, No discharge HENT:  No sore throats, nasal drainage Lymph: No neck swelling, No bruising easily Pulmonary:  No cough, productive sputum CV: No orthopnea, PND  Patient walks 60 minutes for about 2 miles without difficulty.  No exertional chest/neck/shoulder/arm pain. GI: No personal nor family history of GI/colon cancer, inflammatory bowel disease, irritable bowel syndrome, allergy such as Celiac Sprue, dietary/dairy problems, colitis, ulcers nor gastritis.  No recent sick contacts/gastroenteritis.  No travel outside the country.  No changes in diet. Renal: No UTIs, No hematuria Genital:  No drainage, bleeding, masses Musculoskeletal: No severe joint pain.  Good ROM major joints Skin:  No sores or lesions.  No rashes Heme/Lymph:  No easy bleeding.  No swollen lymph nodes Neuro: No focal weakness/numbness.  No seizures Psych: No suicidal ideation.  No hallucinations  BP (!) 156/83    Pulse 97    Temp 99.4 F (37.4 C) (Oral)    Resp 20    LMP  (LMP Unknown)    SpO2 97%   Physical Exam: General: Pt awake/alert/oriented x4 in mild acute distress.  Not toxic but tired and nauseated.  Uncomfortable. Eyes: PERRL, normal EOM. Sclera nonicteric Neuro: CN II-XII intact w/o focal sensory/motor deficits. Lymph: No head/neck/groin lymphadenopathy Psych:  No delerium/psychosis/paranoia HENT: Normocephalic, Mucus membranes moist.  No thrush Neck: Supple, No tracheal deviation Chest: No pain.  Good respiratory excursion. CV:  Pulses intact.  Regular rhythm Abdomen: Obese but Soft, Mildly distended.  Tenderness in left subcostal region and epigastric region.  No peritonitis though.  Supraumbilical mass consistent with incarcerated hernia fat-containing is seen on CAT scan.   Gen:  No inguinal hernias.  No inguinal lymphadenopathy.   Ext:  SCDs BLE.  No significant edema.  No cyanosis Skin: No petechiae / purpurea.  No major sores Musculoskeletal: No  severe joint pain.  Good ROM major joints   Results:   Labs: Results for orders placed or performed during the hospital encounter of 07/09/18 (from the past 48 hour(s))  Lipase, blood     Status: None   Collection Time: 07/09/18  5:40 PM  Result Value Ref Range   Lipase 33 11 - 51 U/L    Comment: Performed at [facility], [address] [place], [place] [address]  Comprehensive metabolic panel     Status: Abnormal   Collection Time: 07/09/18  5:40 PM  Result Value Ref Range   Sodium 139 135 - 145 mmol/L   Potassium 4.1 3.5 - 5.1 mmol/L   Chloride 104 98 - 111 mmol/L   CO2 24 22 - 32 mmol/L   Glucose, Bld 123 (H) 70 - 99 mg/dL   BUN 11 6 - 20 mg/dL   Creatinine, Ser 6.04 0.44 - 1.00 mg/dL   Calcium 9.5 8.9 - 54.0 mg/dL   Total Protein 8.1 6.5 - 8.1 g/dL   Albumin 4.4 3.5 - 5.0 g/dL   AST 21 15 - 41 U/L   ALT 22 0 - 44 U/L   Alkaline Phosphatase 88 38 - 126 U/L   Total Bilirubin 0.5 0.3 - 1.2 mg/dL   GFR calc non Af Amer >60 >60 mL/min   GFR calc Af Amer >60 >60 mL/min   Anion gap 11 5 - 15    Comment: Performed at [facility], [address], [place], [place] [address]  CBC     Status: None   Collection Time: 07/09/18  5:40 PM  Result Value Ref Range   WBC 9.7 4.0 - 10.5 K/uL   RBC 4.72 3.87 - 5.11 MIL/uL   Hemoglobin 13.0 12.0 - 15.0 g/dL   HCT 14.7 82.9 - 56.2 %   MCV 85.2 80.0 - 100.0 fL   MCH 27.5 26.0 - 34.0 pg   MCHC 32.3 30.0 - 36.0 g/dL   RDW 13.0 86.5 - 78.4 %   Platelets 305 150 - 400 K/uL   nRBC 0.0 0.0 - 0.2 %    Comment: Performed at [facility], [address] [place], [place] [address]  Urinalysis, Routine w reflex microscopic     Status: Abnormal   Collection Time: 07/09/18  8:20 PM  Result Value Ref Range   Color, Urine YELLOW YELLOW   APPearance CLEAR CLEAR   Specific Gravity, Urine >1.046 (H) 1.005 - 1.030   pH 6.0 5.0 - 8.0   Glucose, UA NEGATIVE NEGATIVE mg/dL   Hgb urine dipstick  NEGATIVE NEGATIVE   Bilirubin Urine NEGATIVE NEGATIVE   Ketones, ur 20 (A) NEGATIVE mg/dL   Protein, ur NEGATIVE NEGATIVE mg/dL   Nitrite NEGATIVE NEGATIVE   Leukocytes,Ua NEGATIVE NEGATIVE    Comment: Performed at [facility], [address] [place], [place] [address]    Imaging / Studies: Ct Abdomen Pelvis W Contrast  Result Date: 07/09/2018 CLINICAL DATA:  Abdominal pain, nausea vomiting. EXAM: CT ABDOMEN AND PELVIS WITH CONTRAST TECHNIQUE: Multidetector CT imaging of the abdomen and pelvis was performed using the standard protocol following bolus administration of intravenous contrast. CONTRAST:  30mL OMNIPAQUE IOHEXOL 300 MG/ML SOLN, OMNIPAQUE IOHEXOL 300 MG/ML SOLN COMPARISON:  None. FINDINGS: Lower chest: Large hiatal hernia. Hepatobiliary: Large right hepatic mass is stable from 2011, thought to represent a hemangioma. Status post cholecystectomy. No biliary dilatation. Pancreas: Unremarkable. No pancreatic ductal dilatation or surrounding inflammatory changes.  Spleen: Normal in size without focal abnormality. Adrenals/Urinary Tract: Adrenal glands are unremarkable. Kidneys are normal, without renal calculi, focal lesion, or hydronephrosis. Bladder is unremarkable. Stomach/Bowel: Stomach is within normal limits. Appendix appears normal. No evidence of bowel wall thickening, distention, or inflammatory changes. Vascular/Lymphatic: No significant vascular findings are present. No enlarged abdominal or pelvic lymph nodes. Reproductive: Uterus and bilateral adnexa are unremarkable. Other: Wide neck fat containing supraumbilical midline anterior abdominal wall hernia. A second smaller fat containing periumbilical anterior abdominal wall hernia. Musculoskeletal: No acute or significant osseous findings. IMPRESSION: 1. Large hiatal hernia. 2. Stable large right hepatic mass, thought to represent a hemangioma. 3. Two fat containing supraumbilical and periumbilical midline  anterior abdominal wall hernias. Electronically Signed   By: Ted Mcalpine M.D.   On: 07/09/2018 19:40    Medications / Allergies: per chart  Antibiotics: Anti-infectives (From admission, onward)   None        Note: Portions of this report may have been transcribed using voice recognition software. Every effort was made to ensure accuracy; however, inadvertent computerized transcription errors may be present.   Any transcriptional errors that result from this process are unintentional.    Ardeth Sportsman, MD, FACS, MASCRS Gastrointestinal and Minimally Invasive Surgery    [address], [address] [place], [place] [address] [phone] Main / Paging [phone] Fax   07/09/2018

## 2018-07-10 ENCOUNTER — Inpatient Hospital Stay (HOSPITAL_COMMUNITY): Payer: POS

## 2018-07-10 LAB — BASIC METABOLIC PANEL
Anion gap: 8 (ref 5–15)
BUN: 12 mg/dL (ref 6–20)
CO2: 24 mmol/L (ref 22–32)
Calcium: 8.6 mg/dL — ABNORMAL LOW (ref 8.9–10.3)
Chloride: 106 mmol/L (ref 98–111)
Creatinine, Ser: 0.63 mg/dL (ref 0.44–1.00)
GFR calc Af Amer: >60 mL/min (ref >60)
GFR calc non Af Amer: >60 mL/min (ref >60)
Glucose, Bld: 123 mg/dL — ABNORMAL HIGH (ref 70–99)
Potassium: 4.3 mmol/L (ref 3.5–5.1)
Sodium: 138 mmol/L (ref 135–145)

## 2018-07-10 LAB — CBC
HCT: 36.6 % (ref 36.0–46.0)
Hemoglobin: 11.2 g/dL — ABNORMAL LOW (ref 12.0–15.0)
MCH: 27.4 pg (ref 26.0–34.0)
MCHC: 30.6 g/dL (ref 30.0–36.0)
MCV: 89.5 fL (ref 80.0–100.0)
Platelets: 270 10*3/uL (ref 150–400)
RBC: 4.09 MIL/uL (ref 3.87–5.11)
RDW: 13.4 % (ref 11.5–15.5)
WBC: 10.1 10*3/uL (ref 4.0–10.5)
nRBC: 0 % (ref 0.0–0.2)

## 2018-07-10 LAB — PREALBUMIN: Prealbumin: 24 mg/dL (ref 18–38)

## 2018-07-10 LAB — LIPASE, BLOOD: Lipase: 36 U/L (ref 11–51)

## 2018-07-10 NOTE — ED Notes (Signed)
ED TO INPATIENT HANDOFF REPORT  ED Nurse Name and Phone #: Judye Bos Name/Age/Gender Theresa Gilbert 53 y.o. female Room/Bed: WA11/WA11  Code Status   Code Status: Full Code  Home/SNF/Other Home Patient oriented to: self, place, time and situation Is this baseline? Yes   Triage Complete: Triage complete  Chief Complaint Gas Pain, Vomiting  Triage Note Pt reports that she was here month or so ago here for hernia that got twisted. During night starting to have abd pains with belching, nausea and vomiting.    Allergies No Known Allergies  Level of Care/Admitting Diagnosis ED Disposition    ED Disposition Condition Comment   Admit  Hospital Area: [facility] [[id number]]  Level of Care: Med-Surg [16]  Covid Evaluation: Person Under Investigation (PUI)  Isolation Risk Level: Low Risk/Droplet (Less than 4L [place] supplementation)  Diagnosis: Incarcerated hiatal hernia [161096]  Admitting Physician: CCS, MD [[id number]]  Attending Physician: CCS, MD [[id number]]  Estimated length of stay: 3 - 4 days  Certification:: I certify this patient will need inpatient services for at least 2 midnights  Bed request comments: 5W preferred  PT Class (Do Not Modify): Inpatient [101]  PT Acc Code (Do Not Modify): Private [1]       B Medical/Surgery History Past Medical History:  Diagnosis Date  . GERD (gastroesophageal reflux disease)   . Hypertension   . Sleep apnea    Past Surgical History:  Procedure Laterality Date  . arthritic cyst Left    wrist  . CHOLECYSTECTOMY    . GANGLION CYST EXCISION Left    wrist,  . TONSILLECTOMY    . TURBINATE REDUCTION  11/20/2017  . wisdom teeth extaction       A IV Location/Drains/Wounds Patient Lines/Drains/Airways Status   Active Line/Drains/Airways    Name:   Placement date:   Placement time:   Site:   Days:   Peripheral IV 07/09/18 Left Antecubital   07/09/18    1742    Antecubital   1          Intake/Output Last 24  hours  Intake/Output Summary (Last 24 hours) at 07/10/2018 0010 Last data filed at 07/09/2018 2146 Gross per 24 hour  Intake 10 ml  Output -  Net 10 ml    Labs/Imaging Results for orders placed or performed during the hospital encounter of 07/09/18 (from the past 48 hour(s))  Lipase, blood     Status: None   Collection Time: 07/09/18  5:40 PM  Result Value Ref Range   Lipase 33 11 - 51 U/L    Comment: Performed at [facility], [place] [address] [place], [place] [address]  Comprehensive metabolic panel     Status: Abnormal   Collection Time: 07/09/18  5:40 PM  Result Value Ref Range   Sodium 139 135 - 145 mmol/L   Potassium 4.1 3.5 - 5.1 mmol/L   Chloride 104 98 - 111 mmol/L   CO2 24 22 - 32 mmol/L   Glucose, Bld 123 (H) 70 - 99 mg/dL   BUN 11 6 - 20 mg/dL   Creatinine, Ser 0.62 0.44 - 1.00 mg/dL   Calcium 9.5 8.9 - 10.3 mg/dL   Total Protein 8.1 6.5 - 8.1 g/dL   Albumin 4.4 3.5 - 5.0 g/dL   AST 21 15 - 41 U/L   ALT 22 0 - 44 U/L   Alkaline Phosphatase 88 38 - 126 U/L   Total Bilirubin 0.5 0.3 - 1.2 mg/dL  GFR calc non Af Amer >60 >60 mL/min   GFR calc Af Amer >60 >60 mL/min   Anion gap 11 5 - 15    Comment: Performed at [facility], [place] [address], [place], [place] [address]  CBC     Status: None   Collection Time: 07/09/18  5:40 PM  Result Value Ref Range   WBC 9.7 4.0 - 10.5 K/uL   RBC 4.72 3.87 - 5.11 MIL/uL   Hemoglobin 13.0 12.0 - 15.0 g/dL   HCT 40.2 36.0 - 46.0 %   MCV 85.2 80.0 - 100.0 fL   MCH 27.5 26.0 - 34.0 pg   MCHC 32.3 30.0 - 36.0 g/dL   RDW 13.3 11.5 - 15.5 %   Platelets 305 150 - 400 K/uL   nRBC 0.0 0.0 - 0.2 %    Comment: Performed at [facility], [place] [address], [place], [place] [address]  Magnesium     Status: None   Collection Time: 07/09/18  5:40 PM  Result Value Ref Range   Magnesium 2.2 1.7 - 2.4 mg/dL    Comment: Performed at [facility], [place] [address]  [place], [place] [address]  Phosphorus     Status: None   Collection Time: 07/09/18  5:40 PM  Result Value Ref Range   Phosphorus 3.4 2.5 - 4.6 mg/dL    Comment: Performed at [facility], [place] [address], [place], [place] [address]  Urinalysis, Routine w reflex microscopic     Status: Abnormal   Collection Time: 07/09/18  8:20 PM  Result Value Ref Range   Color, Urine YELLOW YELLOW   APPearance CLEAR CLEAR   Specific Gravity, Urine >1.046 (H) 1.005 - 1.030   pH 6.0 5.0 - 8.0   Glucose, UA NEGATIVE NEGATIVE mg/dL   Hgb urine dipstick NEGATIVE NEGATIVE   Bilirubin Urine NEGATIVE NEGATIVE   Ketones, ur 20 (A) NEGATIVE mg/dL   Protein, ur NEGATIVE NEGATIVE mg/dL   Nitrite NEGATIVE NEGATIVE   Leukocytes,Ua NEGATIVE NEGATIVE    Comment: Performed at [place] [address] [place],  [address]  SARS Coronavirus 2 (CEPHEID - Performed in [place] hospital lab), Hosp Order     Status: None   Collection Time: 07/09/18  9:14 PM   Specimen: Nasopharyngeal Swab  Result Value Ref Range   SARS Coronavirus 2 NEGATIVE NEGATIVE    Comment: (NOTE) If result is NEGATIVE SARS-CoV-2 target nucleic acids are NOT DETECTED. The SARS-CoV-2 RNA is generally detectable in upper and lower  respiratory specimens during the acute phase of infection. The lowest  concentration of SARS-CoV-2 viral copies this assay can detect is 250  copies / mL. A negative result does not preclude SARS-CoV-2 infection  and should not be used as the sole basis for treatment or other  patient management decisions.  A negative result may occur with  improper specimen collection / handling, submission of specimen other  than nasopharyngeal swab, presence of viral mutation(s) within the  areas targeted by this assay, and inadequate number of viral copies  (<250 copies / mL). A negative result must be combined with clinical  observations, patient history, and epidemiological  information. If result is POSITIVE SARS-CoV-2 target nucleic acids are DETECTED. The SARS-CoV-2 RNA is generally detectable in upper and lower  respiratory specimens dur ing the acute phase of infection.  Positive  results are indicative of active infection with SARS-CoV-2.  Clinical  correlation with patient history  and other diagnostic information is  necessary to determine patient infection status.  Positive results do  not rule out bacterial infection or co-infection with other viruses. If result is PRESUMPTIVE POSTIVE SARS-CoV-2 nucleic acids MAY BE PRESENT.   A presumptive positive result was obtained on the submitted specimen  and confirmed on repeat testing.  While 2019 novel coronavirus  (SARS-CoV-2) nucleic acids may be present in the submitted sample  additional confirmatory testing may be necessary for epidemiological  and / or clinical management purposes  to differentiate between  SARS-CoV-2 and other Sarbecovirus currently known to infect humans.  If clinically indicated additional testing with an alternate test  methodology [phone]) is advised. The SARS-CoV-2 RNA is generally  detectable in upper and lower respiratory sp ecimens during the acute  phase of infection. The expected result is Negative. Fact Sheet for Patients:  StrictlyIdeas.no Fact Sheet for Healthcare Providers: BankingDealers.co.za This test is not yet approved or cleared by the [place] FDA and has been authorized for detection and/or diagnosis of SARS-CoV-2 by FDA under an Emergency Use Authorization (EUA).  This EUA will remain in effect (meaning this test can be used) for the duration of the COVID-19 declaration under Section 564(b)(1) of the Act, 21 U.S.C. section 360bbb-3(b)(1), unless the authorization is terminated or revoked sooner. Performed at [facility], [place] [address] [place], [place] [address]    Ct Abdomen  Pelvis W Contrast  Result Date: 07/09/2018 CLINICAL DATA:  Abdominal pain, nausea vomiting. EXAM: CT ABDOMEN AND PELVIS WITH CONTRAST TECHNIQUE: Multidetector CT imaging of the abdomen and pelvis was performed using the standard protocol following bolus administration of intravenous contrast. CONTRAST:  32mL OMNIPAQUE IOHEXOL 300 MG/ML SOLN, 116mL OMNIPAQUE IOHEXOL 300 MG/ML SOLN COMPARISON:  None. FINDINGS: Lower chest: Large hiatal hernia. Hepatobiliary: Large right hepatic mass is stable from 2011, thought to represent a hemangioma. Status post cholecystectomy. No biliary dilatation. Pancreas: Unremarkable. No pancreatic ductal dilatation or surrounding inflammatory changes. Spleen: Normal in size without focal abnormality. Adrenals/Urinary Tract: Adrenal glands are unremarkable. Kidneys are normal, without renal calculi, focal lesion, or hydronephrosis. Bladder is unremarkable. Stomach/Bowel: Stomach is within normal limits. Appendix appears normal. No evidence of bowel wall thickening, distention, or inflammatory changes. Vascular/Lymphatic: No significant vascular findings are present. No enlarged abdominal or pelvic lymph nodes. Reproductive: Uterus and bilateral adnexa are unremarkable. Other: Wide neck fat containing supraumbilical midline anterior abdominal wall hernia. A second smaller fat containing periumbilical anterior abdominal wall hernia. Musculoskeletal: No acute or significant osseous findings. IMPRESSION: 1. Large hiatal hernia. 2. Stable large right hepatic mass, thought to represent a hemangioma. 3. Two fat containing supraumbilical and periumbilical midline anterior abdominal wall hernias. Electronically Signed   By: Fidela Salisbury M.D.   On: 07/09/2018 19:40    Pending Labs Unresulted Labs (From admission, onward)    Start     Ordered   07/16/18 0500  Creatinine, serum  (enoxaparin (LOVENOX)    CrCl >/= 30 ml/min)  Weekly,   R    Comments: while on enoxaparin therapy     07/09/18 2229   07/10/18 0500  Hemoglobin A1c  Tomorrow morning,   R    Question:  Specimen collection method  Answer:  IV Team   07/09/18 2131   07/10/18 1324  Basic metabolic panel  Tomorrow morning,   R    Comments: If K < 3.5, give 29mEq KCl in 10MEq runs per protocol.  Pharmacy may adjust dosing strength, schedule, rate of infusion, etc  as needed to optimize therapy   Question:  Specimen collection method  Answer:  IV Team   07/09/18 2131   07/10/18 0500  CBC  Tomorrow morning,   R    Question:  Specimen collection method  Answer:  IV Team   07/09/18 2131   07/09/18 2204  Lipase, blood  Once,   R     07/09/18 2204   07/09/18 2131  Prealbumin  Add-on,   AD    Question:  Specimen collection method  Answer:  IV Team   07/09/18 2131          Vitals/Pain Today's Vitals   07/09/18 2232 07/09/18 2300 07/09/18 2330 07/10/18 0000  BP: (!) 150/93 140/87 (!) 160/98 (!) 165/104  Pulse: 94 93 93 (!) 121  Resp: 10 12 16  (!) 24  Temp:      TempSrc:      SpO2: 95% 99% 97% 99%  PainSc:        Isolation Precautions No active isolations  Medications Medications  fluticasone (FLONASE) 50 MCG/ACT nasal spray 1 spray (has no administration in time range)  enoxaparin (LOVENOX) injection 40 mg (has no administration in time range)  0.9 %  sodium chloride infusion (has no administration in time range)  lactated ringers infusion (has no administration in time range)  bisacodyl (DULCOLAX) suppository 10 mg (has no administration in time range)  prochlorperazine (COMPAZINE) tablet 10 mg (has no administration in time range)    Or  prochlorperazine (COMPAZINE) injection 5-10 mg (has no administration in time range)  simethicone (MYLICON) chewable tablet 40 mg (has no administration in time range)  metoprolol tartrate (LOPRESSOR) injection 5 mg (has no administration in time range)  hydrALAZINE (APRESOLINE) injection 10 mg (has no administration in time range)  lactated ringers bolus 1,000  mL (has no administration in time range)  lactated ringers bolus 1,000 mL (has no administration in time range)  HYDROmorphone (DILAUDID) injection 0.5-2 mg (has no administration in time range)  methocarbamol (ROBAXIN) 1,000 mg in dextrose 5 % 50 mL IVPB (has no administration in time range)  acetaminophen (TYLENOL) suppository 650 mg (has no administration in time range)  ondansetron (ZOFRAN) injection 4 mg (has no administration in time range)    Or  ondansetron (ZOFRAN) 8 mg in sodium chloride 0.9 % 50 mL IVPB (has no administration in time range)  metoCLOPramide (REGLAN) injection 5-10 mg (has no administration in time range)  lip balm (CARMEX) ointment 1 application (has no administration in time range)  magic mouthwash (has no administration in time range)  diphenhydrAMINE (BENADRYL) injection 12.5-25 mg (has no administration in time range)  guaiFENesin-dextromethorphan (ROBITUSSIN DM) 100-10 MG/5ML syrup 10 mL (has no administration in time range)  hydrocortisone (ANUSOL-HC) 2.5 % rectal cream 1 application (has no administration in time range)  alum & mag hydroxide-simeth (MAALOX/MYLANTA) 200-200-20 MG/5ML suspension 30 mL (has no administration in time range)  hydrocortisone cream 1 % 1 application (has no administration in time range)  menthol-cetylpyridinium (CEPACOL) lozenge 3 mg (has no administration in time range)  phenol (CHLORASEPTIC) mouth spray 1-2 spray (has no administration in time range)  enalaprilat (VASOTEC) injection 0.625-1.25 mg (has no administration in time range)  LORazepam (ATIVAN) injection 0.5-1 mg (has no administration in time range)  morphine 4 MG/ML injection 4 mg (4 mg Intravenous Given 07/09/18 1755)  ondansetron (ZOFRAN) injection 4 mg (4 mg Intravenous Given 07/09/18 1752)  iohexol (OMNIPAQUE) 300 MG/ML solution 100 mL (100 mLs Intravenous Contrast Given 07/09/18  1842)  sodium chloride (PF) 0.9 % injection (10 mLs  Given 07/09/18 2146)  iohexol  (OMNIPAQUE) 300 MG/ML solution 30 mL (30 mLs Oral Contrast Given 07/09/18 1842)  morphine 4 MG/ML injection 4 mg (4 mg Intravenous Given 07/09/18 1945)  ondansetron (ZOFRAN) injection 4 mg (4 mg Intravenous Given 07/09/18 1945)  lactated ringers bolus 1,000 mL (1,000 mLs Intravenous New Bag/Given 07/09/18 2142)  metoCLOPramide (REGLAN) injection 10 mg (10 mg Intravenous Given 07/09/18 2139)  morphine 4 MG/ML injection 4 mg (4 mg Intravenous Given 07/09/18 2139)    Mobility walks Low fall risk   Focused Assessments O2 Device: Nasal Cannula O2 Flow Rate (L/min): 1 L/min      R Recommendations: See Admitting Provider Note  Report given to:   Additional Notes: NA

## 2018-07-10 NOTE — Progress Notes (Signed)
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  [facility] Stay = 1 days)  Plan:  IV fluids.  Nausea control.  Pain control.  Nasogastric tube decompression for persistent ileus and hiatal hernia obstruction  I am concerned that she is failing nonoperative management and she will require surgical intervention this admission.  Hiatal hernia reduction with closure.  Long-term outcomes would be best if she had Roux-en-Y gastric reconstruction given her obesity, however that may not be an option if her for stomach is too inflamed and may have to proceed with just hiatal closure with fundoplication.  Intraoperative EGD a possibility.  Ideally this case is not done on the weekends with emergencies, but we will see how she responds to treatment and operative team availability.  Will wait for input from Dr. Kae Heller.  VTE prophylaxis- SCDs, etc  Mobilize as tolerated to help recovery   20 minutes spent in review, evaluation, examination, counseling, and coordination of care.  More than 50% of that time was spent in counseling.  07/10/2018    Subjective: (Chief complaint)  Chest pain and nausea much less.  NG tube annoying but tolerable.  Trying to walk up  more.  Objective:  Vital signs:  Vitals:   07/09/18 2330 07/10/18 0000 07/10/18 0036 07/10/18 0435  BP: (!) 160/98 (!) 165/104 (!) 158/98 114/61  Pulse: 93 (!) 121 99 (!) 112  Resp: 16 (!) 24 18 16   Temp:   99.5 F (37.5 C) 97.8 F (36.6 C)  TempSrc:   Oral Oral  SpO2: 97% 99% 95% 96%    Last BM Date: 07/09/18  Intake/Output   Yesterday:  06/19 0701 - 06/20 0700 In: 1397.7 [I.V.:376.2; IV Piggyback:1021.5] Out: 900 [Urine:300; Emesis/NG output:600] This shift:  No intake/output data recorded.  Bowel function:  Flatus: No  BM:  No  Drain: Bilious   Physical Exam:  General: Pt awake/alert/oriented x4 in mild acute distress Eyes: PERRL, normal EOM.  Sclera clear.  No icterus Neuro: CN II-XII intact w/o focal sensory/motor deficits. Lymph: No head/neck/groin lymphadenopathy Psych:  No delerium/psychosis/paranoia HENT: Normocephalic, Mucus membranes moist.  No thrush Neck: Supple, No tracheal deviation Chest: No chest wall pain w good excursion CV:  Pulses intact.  Regular rhythm MS: Normal AROM mjr joints.  No obvious deformity  Abdomen: Soft.  Mildy distended.  Nontender.  No evidence of peritonitis.  No incarcerated hernias.  Ext:  No deformity.  No mjr edema.  No cyanosis Skin: No petechiae / purpura  Results:   Cultures: Recent Results (from the past 720 hour(s))  SARS Coronavirus 2 (CEPHEID - Performed in [place] hospital lab), Hosp Order     Status: None   Collection Time: 07/09/18  9:14 PM   Specimen: Nasopharyngeal Swab  Result Value Ref Range Status   SARS Coronavirus 2 NEGATIVE NEGATIVE Final    Comment: (NOTE) If result is NEGATIVE SARS-CoV-2 target nucleic acids are NOT DETECTED. The SARS-CoV-2 RNA is generally detectable in upper and lower  respiratory specimens during the acute phase of infection. The lowest  concentration of SARS-CoV-2 viral copies this assay can detect is 250  copies / mL. A negative result does not preclude  SARS-CoV-2 infection  and should not be used as the sole basis for treatment or other  patient management decisions.  A negative result may occur with  improper specimen collection / handling, submission of specimen other  than nasopharyngeal swab, presence of viral mutation(s) within the  areas targeted by this assay, and inadequate number of viral copies  (<250 copies / mL). A negative result must be combined with clinical  observations, patient history, and epidemiological information. If result is POSITIVE SARS-CoV-2 target nucleic acids are DETECTED. The SARS-CoV-2 RNA is generally detectable in upper and lower  respiratory specimens dur ing the acute phase of infection.  Positive  results are indicative of active infection with SARS-CoV-2.  Clinical  correlation with patient history and other diagnostic information is  necessary to determine patient infection status.  Positive results do  not rule out bacterial infection or co-infection with other viruses. If result is PRESUMPTIVE POSTIVE SARS-CoV-2 nucleic acids MAY BE PRESENT.   A presumptive positive result was obtained on the submitted specimen  and confirmed on repeat testing.  While 2019 novel coronavirus  (SARS-CoV-2) nucleic acids may be present in the submitted sample  additional confirmatory testing may be necessary for epidemiological  and / or clinical management purposes  to differentiate between  SARS-CoV-2 and other Sarbecovirus currently known to infect humans.  If clinically indicated additional testing with an alternate test  methodology [phone]) is advised. The SARS-CoV-2 RNA is generally  detectable in upper and lower respiratory sp ecimens during the acute  phase of infection. The expected result is Negative. Fact Sheet for Patients:  StrictlyIdeas.no Fact Sheet for Healthcare Providers: BankingDealers.co.za This test is not yet approved or cleared by the  [place] FDA and has been authorized for detection and/or diagnosis of SARS-CoV-2 by FDA under an Emergency Use Authorization (EUA).  This EUA will remain in effect (meaning this test can be used) for the duration of the COVID-19 declaration under Section 564(b)(1) of the Act, 21 U.S.C. section 360bbb-3(b)(1), unless the authorization is terminated or revoked sooner. Performed at [facility], [place] [address] [place], [place] [address]     Labs: Results for orders placed or performed during the hospital encounter of 07/09/18 (from the past 48 hour(s))  Lipase, blood     Status: None   Collection Time: 07/09/18  5:40 PM  Result Value Ref Range   Lipase 33 11 - 51 U/L    Comment: Performed at [facility], [place] [address] [place], [place] [address]  Comprehensive metabolic panel     Status: Abnormal   Collection Time: 07/09/18  5:40 PM  Result Value Ref Range   Sodium 139 135 - 145 mmol/L   Potassium 4.1 3.5 - 5.1 mmol/L   Chloride 104 98 - 111 mmol/L   CO2 24 22 - 32 mmol/L   Glucose, Bld 123 (H) 70 - 99 mg/dL   BUN 11 6 - 20 mg/dL   Creatinine, Ser 0.62 0.44 - 1.00 mg/dL   Calcium 9.5 8.9 - 10.3 mg/dL   Total  Protein 8.1 6.5 - 8.1 g/dL   Albumin 4.4 3.5 - 5.0 g/dL   AST 21 15 - 41 U/L   ALT 22 0 - 44 U/L   Alkaline Phosphatase 88 38 - 126 U/L   Total Bilirubin 0.5 0.3 - 1.2 mg/dL   GFR calc non Af Amer >60 >60 mL/min   GFR calc Af Amer >60 >60 mL/min   Anion gap 11 5 - 15    Comment: Performed at [facility], [place] [address] [place], [place] [address]  CBC     Status: None   Collection Time: 07/09/18  5:40 PM  Result Value Ref Range   WBC 9.7 4.0 - 10.5 K/uL   RBC 4.72 3.87 - 5.11 MIL/uL   Hemoglobin 13.0 12.0 - 15.0 g/dL   HCT 40.2 36.0 - 46.0 %   MCV 85.2 80.0 - 100.0 fL   MCH 27.5 26.0 - 34.0 pg   MCHC 32.3 30.0 - 36.0 g/dL   RDW 13.3 11.5 - 15.5 %   Platelets 305 150 - 400 K/uL   nRBC 0.0 0.0 - 0.2  %    Comment: Performed at [facility], [place] [address] [place], [place] [address]  Magnesium     Status: None   Collection Time: 07/09/18  5:40 PM  Result Value Ref Range   Magnesium 2.2 1.7 - 2.4 mg/dL    Comment: Performed at [facility], [place] [address], [place], [place] [address]  Phosphorus     Status: None   Collection Time: 07/09/18  5:40 PM  Result Value Ref Range   Phosphorus 3.4 2.5 - 4.6 mg/dL    Comment: Performed at [facility], [place] [address] [place], [place] [address]  Urinalysis, Routine w reflex microscopic     Status: Abnormal   Collection Time: 07/09/18  8:20 PM  Result Value Ref Range   Color, Urine YELLOW YELLOW   APPearance CLEAR CLEAR   Specific Gravity, Urine >1.046 (H) 1.005 - 1.030   pH 6.0 5.0 - 8.0   Glucose, UA NEGATIVE NEGATIVE mg/dL   Hgb urine dipstick NEGATIVE NEGATIVE   Bilirubin Urine NEGATIVE NEGATIVE   Ketones, ur 20 (A) NEGATIVE mg/dL   Protein, ur NEGATIVE NEGATIVE mg/dL   Nitrite NEGATIVE NEGATIVE   Leukocytes,Ua NEGATIVE NEGATIVE    Comment: Performed at [place] [address] [place], [place] [address]  SARS Coronavirus 2 (CEPHEID - Performed in [place] hospital lab), Hosp Order     Status: None   Collection Time: 07/09/18  9:14 PM   Specimen: Nasopharyngeal Swab  Result Value Ref Range   SARS Coronavirus 2 NEGATIVE NEGATIVE    Comment: (NOTE) If result is NEGATIVE SARS-CoV-2 target nucleic acids are NOT DETECTED. The SARS-CoV-2 RNA is generally detectable in upper and lower  respiratory specimens during the acute phase of infection. The lowest  concentration of SARS-CoV-2 viral copies this assay can detect is 250  copies / mL. A negative result does not preclude SARS-CoV-2 infection  and should not be used as the sole basis for treatment or other  patient management decisions.  A negative result may occur with  improper specimen collection /  handling, submission of specimen other  than nasopharyngeal swab, presence of viral mutation(s) within the  areas targeted by this assay, and inadequate number of viral copies  (<250 copies / mL). A negative result must be combined with clinical  observations, patient history, and  epidemiological information. If result is POSITIVE SARS-CoV-2 target nucleic acids are DETECTED. The SARS-CoV-2 RNA is generally detectable in upper and lower  respiratory specimens dur ing the acute phase of infection.  Positive  results are indicative of active infection with SARS-CoV-2.  Clinical  correlation with patient history and other diagnostic information is  necessary to determine patient infection status.  Positive results do  not rule out bacterial infection or co-infection with other viruses. If result is PRESUMPTIVE POSTIVE SARS-CoV-2 nucleic acids MAY BE PRESENT.   A presumptive positive result was obtained on the submitted specimen  and confirmed on repeat testing.  While 2019 novel coronavirus  (SARS-CoV-2) nucleic acids may be present in the submitted sample  additional confirmatory testing may be necessary for epidemiological  and / or clinical management purposes  to differentiate between  SARS-CoV-2 and other Sarbecovirus currently known to infect humans.  If clinically indicated additional testing with an alternate test  methodology [phone]) is advised. The SARS-CoV-2 RNA is generally  detectable in upper and lower respiratory sp ecimens during the acute  phase of infection. The expected result is Negative. Fact Sheet for Patients:  StrictlyIdeas.no Fact Sheet for Healthcare Providers: BankingDealers.co.za This test is not yet approved or cleared by the [place] FDA and has been authorized for detection and/or diagnosis of SARS-CoV-2 by FDA under an Emergency Use Authorization (EUA).  This EUA will remain in effect (meaning this  test can be used) for the duration of the COVID-19 declaration under Section 564(b)(1) of the Act, 21 U.S.C. section 360bbb-3(b)(1), unless the authorization is terminated or revoked sooner. Performed at [facility], [place] [address] [place], [place] [address]   Basic metabolic panel     Status: Abnormal   Collection Time: 07/10/18  4:41 AM  Result Value Ref Range   Sodium 138 135 - 145 mmol/L   Potassium 4.3 3.5 - 5.1 mmol/L   Chloride 106 98 - 111 mmol/L   CO2 24 22 - 32 mmol/L   Glucose, Bld 123 (H) 70 - 99 mg/dL   BUN 12 6 - 20 mg/dL   Creatinine, Ser 0.63 0.44 - 1.00 mg/dL   Calcium 8.6 (L) 8.9 - 10.3 mg/dL   GFR calc non Af Amer >60 >60 mL/min   GFR calc Af Amer >60 >60 mL/min   Anion gap 8 5 - 15    Comment: Performed at [facility], [place] [address], [place], [place] [address]  CBC     Status: Abnormal   Collection Time: 07/10/18  4:41 AM  Result Value Ref Range   WBC 10.1 4.0 - 10.5 K/uL   RBC 4.09 3.87 - 5.11 MIL/uL   Hemoglobin 11.2 (L) 12.0 - 15.0 g/dL   HCT 36.6 36.0 - 46.0 %   MCV 89.5 80.0 - 100.0 fL   MCH 27.4 26.0 - 34.0 pg   MCHC 30.6 30.0 - 36.0 g/dL   RDW 13.4 11.5 - 15.5 %   Platelets 270 150 - 400 K/uL   nRBC 0.0 0.0 - 0.2 %    Comment: Performed at [facility], [place] [address], [place], [place] [address]  Prealbumin     Status: None   Collection Time: 07/10/18  4:41 AM  Result Value Ref Range   Prealbumin 24.0 18 - 38 mg/dL    Comment: Performed at [facility], [place] [address] [place], [place] [address]  Lipase, blood     Status: None  Collection Time: 07/10/18  4:41 AM  Result Value Ref Range   Lipase 36 11 - 51 U/L    Comment: Performed at [facility], [place] [address] [place], [place] [address]    Imaging / Studies: Dg Abd 1 View  Result Date: 07/10/2018 CLINICAL DATA:  Status post NG tube placement. EXAM: ABDOMEN - 1 VIEW COMPARISON:  None.  FINDINGS: NG tube is in good position with both the tip and side-port in the stomach. The stomach is distended. Hiatal hernia noted. IMPRESSION: As above. Electronically Signed   By: Inge Rise M.D.   On: 07/10/2018 02:16   Ct Abdomen Pelvis W Contrast  Result Date: 07/09/2018 CLINICAL DATA:  Abdominal pain, nausea vomiting. EXAM: CT ABDOMEN AND PELVIS WITH CONTRAST TECHNIQUE: Multidetector CT imaging of the abdomen and pelvis was performed using the standard protocol following bolus administration of intravenous contrast. CONTRAST:  72mL OMNIPAQUE IOHEXOL 300 MG/ML SOLN, 123mL OMNIPAQUE IOHEXOL 300 MG/ML SOLN COMPARISON:  None. FINDINGS: Lower chest: Large hiatal hernia. Hepatobiliary: Large right hepatic mass is stable from 2011, thought to represent a hemangioma. Status post cholecystectomy. No biliary dilatation. Pancreas: Unremarkable. No pancreatic ductal dilatation or surrounding inflammatory changes. Spleen: Normal in size without focal abnormality. Adrenals/Urinary Tract: Adrenal glands are unremarkable. Kidneys are normal, without renal calculi, focal lesion, or hydronephrosis. Bladder is unremarkable. Stomach/Bowel: Stomach is within normal limits. Appendix appears normal. No evidence of bowel wall thickening, distention, or inflammatory changes. Vascular/Lymphatic: No significant vascular findings are present. No enlarged abdominal or pelvic lymph nodes. Reproductive: Uterus and bilateral adnexa are unremarkable. Other: Wide neck fat containing supraumbilical midline anterior abdominal wall hernia. A second smaller fat containing periumbilical anterior abdominal wall hernia. Musculoskeletal: No acute or significant osseous findings. IMPRESSION: 1. Large hiatal hernia. 2. Stable large right hepatic mass, thought to represent a hemangioma. 3. Two fat containing supraumbilical and periumbilical midline anterior abdominal wall hernias. Electronically Signed   By: Fidela Salisbury M.D.   On:  07/09/2018 19:40    Medications / Allergies: per chart  Antibiotics: Anti-infectives (From admission, onward)   None        Note: Portions of this report may have been transcribed using voice recognition software. Every effort was made to ensure accuracy; however, inadvertent computerized transcription errors may be present.   Any transcriptional errors that result from this process are unintentional.     Adin Hector, MD, FACS, MASCRS Gastrointestinal and Minimally Invasive Surgery    [address], [place], [place] [address] [phone] Main / Paging [phone] Fax

## 2018-07-11 ENCOUNTER — Inpatient Hospital Stay (HOSPITAL_COMMUNITY): Payer: POS

## 2018-07-11 MED ORDER — PANTOPRAZOLE SODIUM 40 MG IV SOLR
40.0000 mg | Freq: Two times a day (BID) | INTRAVENOUS | Status: DC
  Administered 2018-07-11 – 2018-07-12 (×4): 40 mg via INTRAVENOUS
  Filled 2018-07-11 (×4): qty 40

## 2018-07-11 MED ORDER — SALINE SPRAY 0.65 % NA SOLN
1.0000 | NASAL | Status: DC | PRN
  Filled 2018-07-11: qty 44

## 2018-07-11 MED ORDER — SODIUM CHLORIDE 0.9 % IV SOLN
8.0000 mg | Freq: Four times a day (QID) | INTRAVENOUS | Status: DC
  Administered 2018-07-11 – 2018-07-12 (×3): 8 mg via INTRAVENOUS
  Filled 2018-07-11 (×6): qty 4

## 2018-07-11 MED ORDER — KCL IN DEXTROSE-NACL 40-5-0.45 MEQ/L-%-% IV SOLN
INTRAVENOUS | Status: DC
  Administered 2018-07-11 (×2): via INTRAVENOUS
  Filled 2018-07-11 (×3): qty 1000

## 2018-07-11 MED ORDER — LACTATED RINGERS IV BOLUS: 1000.0000 mL | Freq: Three times a day (TID) | INTRAVENOUS | Status: DC | PRN

## 2018-07-11 MED ORDER — PANTOPRAZOLE SODIUM 40 MG IV SOLR: 40.0000 mg | Freq: Every day | INTRAVENOUS | Status: DC

## 2018-07-11 NOTE — Plan of Care (Signed)
Patient lying in bed this morning; complaining of nausea. IV Zofran given, but patient states it has not been working well. Will reevaluate to see if Zofran effective. Will continue to monitor.

## 2018-07-11 NOTE — Progress Notes (Addendum)
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  [facility] Stay = 2 days)  Plan:  IV fluids.  Switch to D5 1/2 NS with potassium to compensate for NG tube losses  Nausea control.  Improved.  Standing Zofran for 48 hours.  Compazine and Reglan for breakthrough  Pain control.  Less of an issue now  X-ray films to rule out ileus/obstruction.  Check NG tube placement.  Expect NG tube to be technically in the chest where the hiatal hernia is.  PPI for history of GERD.  Increase to twice daily with possible NG tube irritation and worsening nausea symptoms  Try to palliate sinusitis with Magic mouthwash, saline, pain medications.  Nasogastric tube decompression for persistent ileus and hiatal hernia obstruction  I am concerned that she is failing nonoperative management and she will require surgical intervention this admission.  Hiatal hernia reduction with closure.  Long-term outcomes would be best if she had Roux-en-Y gastric reconstruction given her obesity, however that may not be an option if her for stomach is too inflamed and may have to proceed with  just hiatal closure with fundoplication.  Intraoperative EGD a possibility.  Ideally this case is not done on the weekends with emergencies, but we will see how she responds to treatment and operative team availability.  Will wait for input from Dr. Kae Heller.  VTE prophylaxis- SCDs, etc  Mobilize as tolerated to help recovery   20 minutes spent in review, evaluation, examination, counseling, and coordination of care.  More than 50% of that time was spent in counseling.  07/11/2018    Subjective: (Chief complaint)  Chest pain less.  Trying to mobilize been getting nauseated with retching.  Getting headaches and sinus pressure.    Objective:  Vital signs:  Vitals:   07/10/18 1040 07/10/18 1412 07/10/18 2218 07/11/18 0534  BP: 122/70 115/76 (!) 127/57 (!) 145/82  Pulse:  70 74 69  Resp: 18 16 20 15   Temp: 98.1 F (36.7 C) 99.1 F (37.3 C) 98.9 F (37.2 C) 99.1 F (37.3 C)  TempSrc:  Oral Oral Oral  SpO2: 95% 99% 96% 98%    Last BM Date: 07/09/18  Intake/Output   Yesterday:  06/20 0701 - 06/21 0700 In: 8119.1 [P.O.:90; I.V.:2273.2] Out: 750 [Urine:350; Emesis/NG output:400] This shift:  No intake/output data recorded.  Bowel function:  Flatus: No  BM:  No  Drain: Bilious -more thick.  Question of blood-tinged   Physical Exam:  General: Pt awake/alert/oriented x4 in mild acute distress Eyes: PERRL, normal EOM.  Sclera clear.  No icterus Neuro: CN II-XII intact w/o focal sensory/motor deficits. Lymph: No head/neck/groin lymphadenopathy Psych:  No  delerium/psychosis/paranoia HENT: Normocephalic, Mucus membranes moist.  No thrush.  NG tube in place.  Flushes easily through both ports Neck: Supple, No tracheal deviation Chest: No chest wall pain w good excursion CV:  Pulses intact.  Regular rhythm MS: Normal AROM mjr joints.  No obvious deformity  Abdomen: Soft.  Mildy distended.  Nontender.  No evidence of peritonitis.  No incarcerated hernias.  Ext:   No deformity.  No mjr edema.  No cyanosis Skin: No petechiae / purpura  Results:   Cultures: Recent Results (from the past 720 hour(s))  SARS Coronavirus 2 (CEPHEID - Performed in [place] hospital lab), Hosp Order     Status: None   Collection Time: 07/09/18  9:14 PM   Specimen: Nasopharyngeal Swab  Result Value Ref Range Status   SARS Coronavirus 2 NEGATIVE NEGATIVE Final    Comment: (NOTE) If result is NEGATIVE SARS-CoV-2 target nucleic acids are NOT DETECTED. The SARS-CoV-2 RNA is generally detectable in upper and lower  respiratory specimens during the acute phase of infection. The lowest  concentration of SARS-CoV-2 viral copies this assay can detect is 250  copies / mL. A negative result does not preclude SARS-CoV-2 infection  and should not be used as the sole basis for treatment or other  patient management decisions.  A negative result may occur with  improper specimen collection / handling, submission of specimen other  than nasopharyngeal swab, presence of viral mutation(s) within the  areas targeted by this assay, and inadequate number of viral copies  (<250 copies / mL). A negative result must be combined with clinical  observations, patient history, and epidemiological information. If result is POSITIVE SARS-CoV-2 target nucleic acids are DETECTED. The SARS-CoV-2 RNA is generally detectable in upper and lower  respiratory specimens dur ing the acute phase of infection.  Positive  results are indicative of active infection with SARS-CoV-2.  Clinical  correlation with patient history and other diagnostic information is  necessary to determine patient infection status.  Positive results do  not rule out bacterial infection or co-infection with other viruses. If result is PRESUMPTIVE POSTIVE SARS-CoV-2 nucleic acids MAY BE PRESENT.   A presumptive positive result was obtained on the submitted specimen  and confirmed on repeat testing.  While 2019 novel coronavirus    (SARS-CoV-2) nucleic acids may be present in the submitted sample  additional confirmatory testing may be necessary for epidemiological  and / or clinical management purposes  to differentiate between  SARS-CoV-2 and other Sarbecovirus currently known to infect humans.  If clinically indicated additional testing with an alternate test  methodology [phone]) is advised. The SARS-CoV-2 RNA is generally  detectable in upper and lower respiratory sp ecimens during the acute  phase of infection. The expected result is Negative. Fact Sheet for Patients:  StrictlyIdeas.no Fact Sheet for Healthcare Providers: BankingDealers.co.za This test is not yet approved or cleared by the [place] FDA and has been authorized for detection and/or diagnosis of SARS-CoV-2 by FDA under an Emergency Use Authorization (EUA).  This EUA will remain in effect (meaning this test can be used) for the duration of the COVID-19 declaration under Section 564(b)(1) of the Act, 21 U.S.C. section 360bbb-3(b)(1), unless the authorization is terminated or revoked sooner. Performed at [facility], [place] [address] [place], [place] [address]     Labs: Results for orders placed or performed during the hospital encounter of 07/09/18 (from the past 48 hour(s))  Lipase, blood     Status: None  Collection Time: 07/09/18  5:40 PM  Result Value Ref Range   Lipase 33 11 - 51 U/L    Comment: Performed at [facility], [place] [address] [place], [place] [address]  Comprehensive metabolic panel     Status: Abnormal   Collection Time: 07/09/18  5:40 PM  Result Value Ref Range   Sodium 139 135 - 145 mmol/L   Potassium 4.1 3.5 - 5.1 mmol/L   Chloride 104 98 - 111 mmol/L   CO2 24 22 - 32 mmol/L   Glucose, Bld 123 (H) 70 - 99 mg/dL   BUN 11 6 - 20 mg/dL   Creatinine, Ser 0.62 0.44 - 1.00 mg/dL   Calcium 9.5 8.9 - 10.3 mg/dL   Total Protein  8.1 6.5 - 8.1 g/dL   Albumin 4.4 3.5 - 5.0 g/dL   AST 21 15 - 41 U/L   ALT 22 0 - 44 U/L   Alkaline Phosphatase 88 38 - 126 U/L   Total Bilirubin 0.5 0.3 - 1.2 mg/dL   GFR calc non Af Amer >60 >60 mL/min   GFR calc Af Amer >60 >60 mL/min   Anion gap 11 5 - 15    Comment: Performed at [facility], [place] [address], [place], [place] [address]  CBC     Status: None   Collection Time: 07/09/18  5:40 PM  Result Value Ref Range   WBC 9.7 4.0 - 10.5 K/uL   RBC 4.72 3.87 - 5.11 MIL/uL   Hemoglobin 13.0 12.0 - 15.0 g/dL   HCT 40.2 36.0 - 46.0 %   MCV 85.2 80.0 - 100.0 fL   MCH 27.5 26.0 - 34.0 pg   MCHC 32.3 30.0 - 36.0 g/dL   RDW 13.3 11.5 - 15.5 %   Platelets 305 150 - 400 K/uL   nRBC 0.0 0.0 - 0.2 %    Comment: Performed at [facility], [place] [address], [place], [place] [address]  Magnesium     Status: None   Collection Time: 07/09/18  5:40 PM  Result Value Ref Range   Magnesium 2.2 1.7 - 2.4 mg/dL    Comment: Performed at [facility], [place] [address] [place], [place] [address]  Phosphorus     Status: None   Collection Time: 07/09/18  5:40 PM  Result Value Ref Range   Phosphorus 3.4 2.5 - 4.6 mg/dL    Comment: Performed at [facility], [place] [address] [place], [place] [address]  Urinalysis, Routine w reflex microscopic     Status: Abnormal   Collection Time: 07/09/18  8:20 PM  Result Value Ref Range   Color, Urine YELLOW YELLOW   APPearance CLEAR CLEAR   Specific Gravity, Urine >1.046 (H) 1.005 - 1.030   pH 6.0 5.0 - 8.0   Glucose, UA NEGATIVE NEGATIVE mg/dL   Hgb urine dipstick NEGATIVE NEGATIVE   Bilirubin Urine NEGATIVE NEGATIVE   Ketones, ur 20 (A) NEGATIVE mg/dL   Protein, ur NEGATIVE NEGATIVE mg/dL   Nitrite NEGATIVE NEGATIVE   Leukocytes,Ua NEGATIVE NEGATIVE    Comment: Performed at [place] [address], [place], [place] [address]  SARS Coronavirus 2 (CEPHEID -  Performed in [place] hospital lab), Hosp Order     Status: None   Collection Time: 07/09/18  9:14 PM   Specimen: Nasopharyngeal Swab  Result Value Ref Range   SARS Coronavirus 2 NEGATIVE NEGATIVE    Comment: (NOTE) If result  is NEGATIVE SARS-CoV-2 target nucleic acids are NOT DETECTED. The SARS-CoV-2 RNA is generally detectable in upper and lower  respiratory specimens during the acute phase of infection. The lowest  concentration of SARS-CoV-2 viral copies this assay can detect is 250  copies / mL. A negative result does not preclude SARS-CoV-2 infection  and should not be used as the sole basis for treatment or other  patient management decisions.  A negative result may occur with  improper specimen collection / handling, submission of specimen other  than nasopharyngeal swab, presence of viral mutation(s) within the  areas targeted by this assay, and inadequate number of viral copies  (<250 copies / mL). A negative result must be combined with clinical  observations, patient history, and epidemiological information. If result is POSITIVE SARS-CoV-2 target nucleic acids are DETECTED. The SARS-CoV-2 RNA is generally detectable in upper and lower  respiratory specimens dur ing the acute phase of infection.  Positive  results are indicative of active infection with SARS-CoV-2.  Clinical  correlation with patient history and other diagnostic information is  necessary to determine patient infection status.  Positive results do  not rule out bacterial infection or co-infection with other viruses. If result is PRESUMPTIVE POSTIVE SARS-CoV-2 nucleic acids MAY BE PRESENT.   A presumptive positive result was obtained on the submitted specimen  and confirmed on repeat testing.  While 2019 novel coronavirus  (SARS-CoV-2) nucleic acids may be present in the submitted sample  additional confirmatory testing may be necessary for epidemiological  and / or clinical management purposes  to  differentiate between  SARS-CoV-2 and other Sarbecovirus currently known to infect humans.  If clinically indicated additional testing with an alternate test  methodology [phone]) is advised. The SARS-CoV-2 RNA is generally  detectable in upper and lower respiratory sp ecimens during the acute  phase of infection. The expected result is Negative. Fact Sheet for Patients:  StrictlyIdeas.no Fact Sheet for Healthcare Providers: BankingDealers.co.za This test is not yet approved or cleared by the [place] FDA and has been authorized for detection and/or diagnosis of SARS-CoV-2 by FDA under an Emergency Use Authorization (EUA).  This EUA will remain in effect (meaning this test can be used) for the duration of the COVID-19 declaration under Section 564(b)(1) of the Act, 21 U.S.C. section 360bbb-3(b)(1), unless the authorization is terminated or revoked sooner. Performed at [facility], [place] [address] [place], [place] [address]   Basic metabolic panel     Status: Abnormal   Collection Time: 07/10/18  4:41 AM  Result Value Ref Range   Sodium 138 135 - 145 mmol/L   Potassium 4.3 3.5 - 5.1 mmol/L   Chloride 106 98 - 111 mmol/L   CO2 24 22 - 32 mmol/L   Glucose, Bld 123 (H) 70 - 99 mg/dL   BUN 12 6 - 20 mg/dL   Creatinine, Ser 0.63 0.44 - 1.00 mg/dL   Calcium 8.6 (L) 8.9 - 10.3 mg/dL   GFR calc non Af Amer >60 >60 mL/min   GFR calc Af Amer >60 >60 mL/min   Anion gap 8 5 - 15    Comment: Performed at [facility], [place] [address] [place], [place] [address]  CBC     Status: Abnormal   Collection Time: 07/10/18  4:41 AM  Result Value Ref Range   WBC 10.1 4.0 - 10.5 K/uL   RBC 4.09 3.87 - 5.11 MIL/uL   Hemoglobin 11.2 (L) 12.0 - 15.0 g/dL  HCT 36.6 36.0 - 46.0 %   MCV 89.5 80.0 - 100.0 fL   MCH 27.4 26.0 - 34.0 pg   MCHC 30.6 30.0 - 36.0 g/dL   RDW 13.4 11.5 - 15.5 %   Platelets 270 150 - 400  K/uL   nRBC 0.0 0.0 - 0.2 %    Comment: Performed at [facility], [place] [address], [place], [place] [address]  Prealbumin     Status: None   Collection Time: 07/10/18  4:41 AM  Result Value Ref Range   Prealbumin 24.0 18 - 38 mg/dL    Comment: Performed at [facility], [place] [address], [place], [place] [address]  Lipase, blood     Status: None   Collection Time: 07/10/18  4:41 AM  Result Value Ref Range   Lipase 36 11 - 51 U/L    Comment: Performed at [facility], [place] [address], [place], [place] [address]    Imaging / Studies: Dg Abd 1 View  Result Date: 07/10/2018 CLINICAL DATA:  Status post NG tube placement. EXAM: ABDOMEN - 1 VIEW COMPARISON:  None. FINDINGS: NG tube is in good position with both the tip and side-port in the stomach. The stomach is distended. Hiatal hernia noted. IMPRESSION: As above. Electronically Signed   By: Inge Rise M.D.   On: 07/10/2018 02:16   Ct Abdomen Pelvis W Contrast  Result Date: 07/09/2018 CLINICAL DATA:  Abdominal pain, nausea vomiting. EXAM: CT ABDOMEN AND PELVIS WITH CONTRAST TECHNIQUE: Multidetector CT imaging of the abdomen and pelvis was performed using the standard protocol following bolus administration of intravenous contrast. CONTRAST:  38mL OMNIPAQUE IOHEXOL 300 MG/ML SOLN, 180mL OMNIPAQUE IOHEXOL 300 MG/ML SOLN COMPARISON:  None. FINDINGS: Lower chest: Large hiatal hernia. Hepatobiliary: Large right hepatic mass is stable from 2011, thought to represent a hemangioma. Status post cholecystectomy. No biliary dilatation. Pancreas: Unremarkable. No pancreatic ductal dilatation or surrounding inflammatory changes. Spleen: Normal in size without focal abnormality. Adrenals/Urinary Tract: Adrenal glands are unremarkable. Kidneys are normal, without renal calculi, focal lesion, or hydronephrosis. Bladder is unremarkable. Stomach/Bowel: Stomach is within normal limits. Appendix appears  normal. No evidence of bowel wall thickening, distention, or inflammatory changes. Vascular/Lymphatic: No significant vascular findings are present. No enlarged abdominal or pelvic lymph nodes. Reproductive: Uterus and bilateral adnexa are unremarkable. Other: Wide neck fat containing supraumbilical midline anterior abdominal wall hernia. A second smaller fat containing periumbilical anterior abdominal wall hernia. Musculoskeletal: No acute or significant osseous findings. IMPRESSION: 1. Large hiatal hernia. 2. Stable large right hepatic mass, thought to represent a hemangioma. 3. Two fat containing supraumbilical and periumbilical midline anterior abdominal wall hernias. Electronically Signed   By: Fidela Salisbury M.D.   On: 07/09/2018 19:40    Medications / Allergies: per chart  Antibiotics: Anti-infectives (From admission, onward)   None        Note: Portions of this report may have been transcribed using voice recognition software. Every effort was made to ensure accuracy; however, inadvertent computerized transcription errors may be present.   Any transcriptional errors that result from this process are unintentional.     Adin Hector, MD, FACS, MASCRS Gastrointestinal and Minimally Invasive Surgery    [address], [place], [place] [address] [phone] Main / Paging [phone] Fax

## 2018-07-11 NOTE — Progress Notes (Signed)
Order received to advance NG tube 15cm; Suction turned off; NG tube advanced without incident; retaped to secure in place. Flushed to ensure patency; flushed well. Resumed low intermittent suction. Patient tolerated well.

## 2018-07-11 NOTE — Progress Notes (Signed)
Nutrition Brief Note RD working remotely.   Patient identified on the Malnutrition Screening Tool (MST) Report.  Wt Readings from Last 15 Encounters:  06/02/18 117 kg  05/31/18 113.4 kg  09/16/15 97.5 kg  06/04/15 115.5 kg    There is no height or weight on file to calculate BMI. Re-weigh patient today. Patient reports she has had a good appetite/no changes in appetite and that she has been trying to lose weight in order to undergo surgery (Roux-en-Y gastric bypass).  Current diet order is NPO. Labs and medications reviewed.   No nutrition interventions warranted at this time. If nutrition issues arise, please consult RD.      Jarome Matin, MS, RD, LDN, [facility] Inpatient Clinical Dietitian Pager # [phone] After hours/weekend pager # [phone]

## 2018-07-12 ENCOUNTER — Encounter (HOSPITAL_COMMUNITY): Admission: EM | Disposition: A | Discharge status: Home / Self Care | Payer: Self-pay | Source: Home / Self Care

## 2018-07-12 ENCOUNTER — Inpatient Hospital Stay (HOSPITAL_COMMUNITY): Payer: POS | Admitting: Certified Registered"

## 2018-07-12 ENCOUNTER — Encounter (HOSPITAL_COMMUNITY): Payer: Self-pay | Admitting: Anesthesiology

## 2018-07-12 HISTORY — PX: UPPER GI ENDOSCOPY: SHX6162

## 2018-07-12 HISTORY — PX: LAPAROSCOPIC NISSEN FUNDOPLICATION: SHX1932

## 2018-07-12 LAB — CBC
HCT: 35.9 % — ABNORMAL LOW (ref 36.0–46.0)
Hemoglobin: 10.9 g/dL — ABNORMAL LOW (ref 12.0–15.0)
MCH: 26.8 pg (ref 26.0–34.0)
MCHC: 30.4 g/dL (ref 30.0–36.0)
MCV: 88.4 fL (ref 80.0–100.0)
Platelets: 249 10*3/uL (ref 150–400)
RBC: 4.06 MIL/uL (ref 3.87–5.11)
RDW: 13.2 % (ref 11.5–15.5)
WBC: 6.6 10*3/uL (ref 4.0–10.5)
nRBC: 0 % (ref 0.0–0.2)

## 2018-07-12 LAB — MAGNESIUM: Magnesium: 2.1 mg/dL (ref 1.7–2.4)

## 2018-07-12 LAB — HEMOGLOBIN A1C
Hgb A1c MFr Bld: 5.5 % (ref 4.8–5.6)
Mean Plasma Glucose: 111 mg/dL

## 2018-07-12 LAB — CREATININE, SERUM
Creatinine, Ser: 0.55 mg/dL (ref 0.44–1.00)
GFR calc Af Amer: >60 mL/min (ref >60)
GFR calc non Af Amer: >60 mL/min (ref >60)

## 2018-07-12 LAB — SURGICAL PCR SCREEN
MRSA, PCR: NEGATIVE
Staphylococcus aureus: NEGATIVE

## 2018-07-12 LAB — POTASSIUM: Potassium: 3.8 mmol/L (ref 3.5–5.1)

## 2018-07-12 SURGERY — FUNDOPLICATION, NISSEN, LAPAROSCOPIC
Anesthesia: General

## 2018-07-12 MED ORDER — ENOXAPARIN SODIUM 40 MG/0.4ML ~~LOC~~ SOLN
40.0000 mg | Freq: Every day | SUBCUTANEOUS | Status: DC
  Administered 2018-07-13: 08:00:00 40 mg via SUBCUTANEOUS
  Filled 2018-07-12: qty 0.4

## 2018-07-12 MED ORDER — ACETAMINOPHEN 160 MG/5ML PO SOLN
650.0000 mg | Freq: Four times a day (QID) | ORAL | Status: DC
  Administered 2018-07-12 – 2018-07-13 (×3): 650 mg via ORAL
  Filled 2018-07-12 (×3): qty 20.3

## 2018-07-12 MED ORDER — CEFAZOLIN SODIUM-DEXTROSE 2-4 GM/100ML-% IV SOLN
2.0000 g | INTRAVENOUS | Status: AC
  Administered 2018-07-12: 2 g via INTRAVENOUS
  Filled 2018-07-12 (×2): qty 100

## 2018-07-12 MED ORDER — LACTATED RINGERS IV SOLN
INTRAVENOUS | Status: DC
  Administered 2018-07-12 (×2): via INTRAVENOUS

## 2018-07-12 MED ORDER — MIDAZOLAM HCL 2 MG/2ML IJ SOLN
INTRAMUSCULAR | Status: AC
  Filled 2018-07-12: qty 2

## 2018-07-12 MED ORDER — FENTANYL CITRATE (PF) 100 MCG/2ML IJ SOLN
INTRAMUSCULAR | Status: AC
  Filled 2018-07-12: qty 2

## 2018-07-12 MED ORDER — TRAMADOL HCL 50 MG PO TABS
50.0000 mg | ORAL_TABLET | Freq: Four times a day (QID) | ORAL | Status: DC | PRN
  Administered 2018-07-12: 50 mg via ORAL
  Filled 2018-07-12: qty 1

## 2018-07-12 MED ORDER — BUPIVACAINE-EPINEPHRINE (PF) 0.25% -1:200000 IJ SOLN
INTRAMUSCULAR | Status: DC | PRN
  Administered 2018-07-12: 30 mL

## 2018-07-12 MED ORDER — PHENYLEPHRINE 40 MCG/ML (10ML) SYRINGE FOR IV PUSH (FOR BLOOD PRESSURE SUPPORT)
PREFILLED_SYRINGE | INTRAVENOUS | Status: DC | PRN
  Administered 2018-07-12: 80 ug via INTRAVENOUS
  Administered 2018-07-12: 40 ug via INTRAVENOUS

## 2018-07-12 MED ORDER — LACTATED RINGERS IR SOLN
Status: DC | PRN
  Administered 2018-07-12: 1

## 2018-07-12 MED ORDER — METHOCARBAMOL 1000 MG/10ML IJ SOLN
1000.0000 mg | Freq: Four times a day (QID) | INTRAVENOUS | Status: DC | PRN
  Filled 2018-07-12: qty 10

## 2018-07-12 MED ORDER — GABAPENTIN 300 MG PO CAPS
300.0000 mg | ORAL_CAPSULE | Freq: Three times a day (TID) | ORAL | Status: DC
  Administered 2018-07-12 – 2018-07-13 (×3): 300 mg via ORAL
  Filled 2018-07-12 (×3): qty 1

## 2018-07-12 MED ORDER — FENTANYL CITRATE (PF) 100 MCG/2ML IJ SOLN
25.0000 ug | INTRAMUSCULAR | Status: DC | PRN
  Administered 2018-07-12 (×2): 50 ug via INTRAVENOUS

## 2018-07-12 MED ORDER — HYDROMORPHONE HCL 1 MG/ML IJ SOLN: 0.5000 mg | INTRAMUSCULAR | Status: DC | PRN

## 2018-07-12 MED ORDER — ROCURONIUM BROMIDE 10 MG/ML (PF) SYRINGE
PREFILLED_SYRINGE | INTRAVENOUS | Status: AC
  Filled 2018-07-12: qty 10

## 2018-07-12 MED ORDER — 0.9 % SODIUM CHLORIDE (POUR BTL) OPTIME
TOPICAL | Status: DC | PRN
  Administered 2018-07-12: 1000 mL

## 2018-07-12 MED ORDER — ONDANSETRON HCL 4 MG/2ML IJ SOLN
4.0000 mg | Freq: Four times a day (QID) | INTRAMUSCULAR | Status: DC | PRN
  Administered 2018-07-12: 21:00:00 4 mg via INTRAVENOUS
  Filled 2018-07-12: qty 2

## 2018-07-12 MED ORDER — MIDAZOLAM HCL 5 MG/5ML IJ SOLN
INTRAMUSCULAR | Status: DC | PRN
  Administered 2018-07-12: 2 mg via INTRAVENOUS

## 2018-07-12 MED ORDER — PROPOFOL 10 MG/ML IV BOLUS
INTRAVENOUS | Status: AC
  Filled 2018-07-12: qty 20

## 2018-07-12 MED ORDER — ONDANSETRON HCL 4 MG/2ML IJ SOLN
INTRAMUSCULAR | Status: DC | PRN
  Administered 2018-07-12 (×2): 4 mg via INTRAVENOUS

## 2018-07-12 MED ORDER — PROPOFOL 10 MG/ML IV BOLUS
INTRAVENOUS | Status: DC | PRN
  Administered 2018-07-12: 180 mg via INTRAVENOUS

## 2018-07-12 MED ORDER — METOCLOPRAMIDE HCL 5 MG/ML IJ SOLN
10.0000 mg | Freq: Four times a day (QID) | INTRAMUSCULAR | Status: DC
  Administered 2018-07-12 – 2018-07-13 (×4): 10 mg via INTRAVENOUS
  Filled 2018-07-12 (×4): qty 2

## 2018-07-12 MED ORDER — DEXAMETHASONE SODIUM PHOSPHATE 10 MG/ML IJ SOLN
INTRAMUSCULAR | Status: DC | PRN
  Administered 2018-07-12: 10 mg via INTRAVENOUS

## 2018-07-12 MED ORDER — BUPIVACAINE-EPINEPHRINE (PF) 0.25% -1:200000 IJ SOLN
INTRAMUSCULAR | Status: AC
  Filled 2018-07-12: qty 30

## 2018-07-12 MED ORDER — PHENYLEPHRINE 40 MCG/ML (10ML) SYRINGE FOR IV PUSH (FOR BLOOD PRESSURE SUPPORT)
PREFILLED_SYRINGE | INTRAVENOUS | Status: AC
  Filled 2018-07-12: qty 10

## 2018-07-12 MED ORDER — SUCCINYLCHOLINE CHLORIDE 200 MG/10ML IV SOSY
PREFILLED_SYRINGE | INTRAVENOUS | Status: DC | PRN
  Administered 2018-07-12: 160 mg via INTRAVENOUS

## 2018-07-12 MED ORDER — ONDANSETRON HCL 4 MG/2ML IJ SOLN: 4.0000 mg | Freq: Once | INTRAMUSCULAR | Status: DC | PRN

## 2018-07-12 MED ORDER — LORAZEPAM 2 MG/ML IJ SOLN: 0.5000 mg | Freq: Three times a day (TID) | INTRAMUSCULAR | Status: DC | PRN

## 2018-07-12 MED ORDER — BUPIVACAINE LIPOSOME 1.3 % IJ SUSP
20.0000 mL | Freq: Once | INTRAMUSCULAR | Status: AC
  Administered 2018-07-12: 20 mL
  Filled 2018-07-12: qty 20

## 2018-07-12 MED ORDER — LIDOCAINE 2% (20 MG/ML) 5 ML SYRINGE
INTRAMUSCULAR | Status: DC | PRN
  Administered 2018-07-12 (×2): 50 mg via INTRAVENOUS

## 2018-07-12 MED ORDER — FENTANYL CITRATE (PF) 100 MCG/2ML IJ SOLN
INTRAMUSCULAR | Status: DC | PRN
  Administered 2018-07-12 (×5): 50 ug via INTRAVENOUS
  Administered 2018-07-12: 100 ug via INTRAVENOUS
  Administered 2018-07-12: 50 ug via INTRAVENOUS

## 2018-07-12 MED ORDER — ROCURONIUM BROMIDE 10 MG/ML (PF) SYRINGE
PREFILLED_SYRINGE | INTRAVENOUS | Status: DC | PRN
  Administered 2018-07-12: 10 mg via INTRAVENOUS
  Administered 2018-07-12 (×2): 15 mg via INTRAVENOUS
  Administered 2018-07-12: 20 mg via INTRAVENOUS
  Administered 2018-07-12: 60 mg via INTRAVENOUS
  Administered 2018-07-12 (×2): 10 mg via INTRAVENOUS

## 2018-07-12 MED ORDER — SUGAMMADEX SODIUM 500 MG/5ML IV SOLN
INTRAVENOUS | Status: AC
  Filled 2018-07-12: qty 5

## 2018-07-12 MED ORDER — DOCUSATE SODIUM 100 MG PO CAPS
100.0000 mg | ORAL_CAPSULE | Freq: Two times a day (BID) | ORAL | Status: DC
  Administered 2018-07-12 – 2018-07-13 (×2): 100 mg via ORAL
  Filled 2018-07-12 (×2): qty 1

## 2018-07-12 SURGICAL SUPPLY — 55 items
APPLIER CLIP ROT 10 11.4 M/L (STAPLE)
CABLE HIGH FREQUENCY MONO STRZ (ELECTRODE) ×3 IMPLANT
CLIP APPLIE ROT 10 11.4 M/L (STAPLE) IMPLANT
COVER SURGICAL LIGHT HANDLE (MISCELLANEOUS) ×3 IMPLANT
COVER WAND RF STERILE (DRAPES) IMPLANT
DECANTER SPIKE VIAL GLASS SM (MISCELLANEOUS) ×3 IMPLANT
DERMABOND ADVANCED (GAUZE/BANDAGES/DRESSINGS) ×2
DERMABOND ADVANCED .7 DNX12 (GAUZE/BANDAGES/DRESSINGS) ×1 IMPLANT
DEVICE SUT QUICK LOAD TK 5 (STAPLE) ×7 IMPLANT
DEVICE SUT TI-KNOT TK 5X26 (MISCELLANEOUS) ×1 IMPLANT
DEVICE SUTURE ENDOST 10MM (ENDOMECHANICALS) ×2 IMPLANT
DEVICE TI KNOT TK5 (MISCELLANEOUS) ×1
DISSECTOR BLUNT TIP ENDO 5MM (MISCELLANEOUS) IMPLANT
DRAIN CHANNEL 19F RND (DRAIN) ×2 IMPLANT
DRAIN PENROSE 18X1/2 LTX STRL (DRAIN) ×3 IMPLANT
DRAPE LAPAROSCOPIC ABDOMINAL (DRAPES) ×2 IMPLANT
DRSG TEGADERM 4X4.75 (GAUZE/BANDAGES/DRESSINGS) ×2 IMPLANT
ELECT REM PT RETURN 15FT ADLT (MISCELLANEOUS) ×3 IMPLANT
EVACUATOR DRAINAGE 7X20 100CC (MISCELLANEOUS) IMPLANT
EVACUATOR SILICONE 100CC (MISCELLANEOUS) ×2
GLOVE BIO SURGEON STRL SZ 6 (GLOVE) ×3 IMPLANT
GLOVE INDICATOR 6.5 STRL GRN (GLOVE) ×3 IMPLANT
GOWN STRL REUS W/TWL LRG LVL3 (GOWN DISPOSABLE) ×3 IMPLANT
GOWN STRL REUS W/TWL XL LVL3 (GOWN DISPOSABLE) ×8 IMPLANT
GRASPER SUT TROCAR 14GX15 (MISCELLANEOUS) ×2 IMPLANT
KIT BASIN OR (CUSTOM PROCEDURE TRAY) ×3 IMPLANT
KIT TURNOVER KIT A (KITS) ×2 IMPLANT
LEGGING LITHOTOMY PAIR STRL (DRAPES) ×1 IMPLANT
MARKER SKIN DUAL TIP RULER LAB (MISCELLANEOUS) ×3 IMPLANT
NDL INSUFFLATION 14GA 120MM (NEEDLE) IMPLANT
NEEDLE INSUFFLATION 14GA 120MM (NEEDLE) IMPLANT
POUCH SPECIMEN RETRIEVAL 10MM (ENDOMECHANICALS) ×2 IMPLANT
PROTECTOR NERVE ULNAR (MISCELLANEOUS) IMPLANT
QUICK LOAD TK 5 (STAPLE) ×7
SCISSORS LAP 5X35 DISP (ENDOMECHANICALS) ×3 IMPLANT
SET IRRIG TUBING LAPAROSCOPIC (IRRIGATION / IRRIGATOR) ×3 IMPLANT
SET TUBE SMOKE EVAC HIGH FLOW (TUBING) ×6 IMPLANT
SHEARS HARMONIC ACE PLUS 36CM (ENDOMECHANICALS) ×3 IMPLANT
SLEEVE XCEL OPT CAN 5 100 (ENDOMECHANICALS) ×6 IMPLANT
SPONGE DRAIN TRACH 4X4 STRL 2S (GAUZE/BANDAGES/DRESSINGS) ×2 IMPLANT
SUT ETHIBOND 0 36 GRN (SUTURE) ×6 IMPLANT
SUT ETHILON 2 0 PS N (SUTURE) ×2 IMPLANT
SUT MNCRL AB 4-0 PS2 18 (SUTURE) ×3 IMPLANT
SUT SURGIDAC NAB ES-9 0 48 120 (SUTURE) ×14 IMPLANT
TAPE CLOTH 4X10 WHT NS (GAUZE/BANDAGES/DRESSINGS) ×1 IMPLANT
TIP INNERVISION DETACH 40FR (MISCELLANEOUS) IMPLANT
TIP INNERVISION DETACH 50FR (MISCELLANEOUS) IMPLANT
TIP INNERVISION DETACH 56FR (MISCELLANEOUS) ×2 IMPLANT
TIPS INNERVISION DETACH 40FR (MISCELLANEOUS)
TOWEL OR 17X26 10 PK STRL BLUE (TOWEL DISPOSABLE) ×3 IMPLANT
TOWEL OR NON WOVEN STRL DISP B (DISPOSABLE) ×3 IMPLANT
TRAY FOLEY MTR SLVR 16FR STAT (SET/KITS/TRAYS/PACK) ×2 IMPLANT
TRAY LAPAROSCOPIC (CUSTOM PROCEDURE TRAY) ×3 IMPLANT
TROCAR BLADELESS OPT 5 100 (ENDOMECHANICALS) ×3 IMPLANT
TROCAR XCEL NON-BLD 11X100MML (ENDOMECHANICALS) ×3 IMPLANT

## 2018-07-12 NOTE — Progress Notes (Addendum)
Subjective: CC: Hiatal hernia Patient reports abdominal pain has resolved. Did have some "acid reflux" overnight that she described as burping/belnching and heart burn. Some nausea overnight that is better this morning. NGT with 250cc output/24 hours. Passing some flatus. No BM.   Objective: Vital signs in last 24 hours: Temp:  [97.8 F (36.6 C)-98.9 F (37.2 C)] 98.9 F (37.2 C) (06/22 0600) Pulse Rate:  [76-83] 80 (06/22 0600) Resp:  [18-20] 18 (06/22 0600) BP: (161-180)/(89-98) 161/89 (06/22 0600) SpO2:  [93 %-95 %] 95 % (06/22 0600) Weight:  [110.1 kg] 110.1 kg (06/21 1629) Last BM Date: 07/09/18  Intake/Output from previous day: 06/21 0701 - 06/22 0700 In: 2581.1 [P.O.:40; I.V.:2193.1; NG/GT:240; IV Piggyback:108] Out: 850 [Urine:600; Emesis/NG output:250] Intake/Output this shift: Total I/O In: 0  Out: 300 [Urine:300]  PE: GeN: Awake and alert, NAD Heart: Regular Lungs: CTA b/l, normal effort and rate Abd: Soft, ND, NT, no r/r/g, mildly hypoactive bowel sounds. NGT in place. 250cc output/24 hours.  Msk: No edema.  Lab Results:  Recent Labs    07/10/18 0441 07/12/18 0337  WBC 10.1 6.6  HGB 11.2* 10.9*  HCT 36.6 35.9*  PLT 270 249   BMET Recent Labs    07/09/18 1740 07/10/18 0441 07/12/18 0337  NA 139 138  --   K 4.1 4.3 3.8  CL 104 106  --   CO2 24 24  --   GLUCOSE 123* 123*  --   BUN 11 12  --   CREATININE 0.62 0.63 0.55  CALCIUM 9.5 8.6*  --    PT/INR No results for input(s): LABPROT, INR in the last 72 hours. CMP     Component Value Date/Time   NA 138 07/10/2018 0441   K 3.8 07/12/2018 0337   CL 106 07/10/2018 0441   CO2 24 07/10/2018 0441   GLUCOSE 123 (H) 07/10/2018 0441   BUN 12 07/10/2018 0441   CREATININE 0.55 07/12/2018 0337   CREATININE 0.66 06/04/2015 1354   CALCIUM 8.6 (L) 07/10/2018 0441   PROT 8.1 07/09/2018 1740   ALBUMIN 4.4 07/09/2018 1740   AST 21 07/09/2018 1740   ALT 22 07/09/2018 1740   ALKPHOS 88  07/09/2018 1740   BILITOT 0.5 07/09/2018 1740   GFRNONAA >60 07/12/2018 0337   GFRNONAA >89 06/04/2015 1354   GFRAA >60 07/12/2018 0337   GFRAA >89 06/04/2015 1354   Lipase     Component Value Date/Time   LIPASE 36 07/10/2018 0441       Studies/Results: Dg Abd Acute 2+v W 1v Chest  Result Date: 07/11/2018 CLINICAL DATA:  Hiatal hernia with obstruction.  NG tube placement. EXAM: DG ABDOMEN ACUTE W/ 1V CHEST COMPARISON:  Chest x-ray 06/04/2018 and KUB 07/10/2018, CT 07/09/2018 FINDINGS: Lungs are adequately inflated with minimal linear density left base likely atelectasis. There is retrocardiac opacification compatible patient's known hiatal hernia. Cardiomediastinal silhouette and remainder of the chest is unchanged. Nasogastric tube has tip just right of midline in the mid abdomen likely over the distal stomach. Bowel gas pattern is nonobstructive. Surgical clips over the right upper quadrant. Remainder the exam is unchanged. IMPRESSION: Nonobstructive bowel gas pattern. Nasogastric tube with tip right of midline in the mid abdomen likely over the distal stomach. Minimal linear atelectasis left base. Moderate size hiatal hernia. Electronically Signed   By: Marin Olp M.D.   On: 07/11/2018 13:09    Anti-infectives: Anti-infectives (From admission, onward)   Start     Dose/Rate  Route Frequency Ordered Stop   07/12/18 0745  ceFAZolin (ANCEF) IVPB 2g/100 mL premix     2 g 200 mL/hr over 30 Minutes Intravenous On call to O.R. 07/12/18 0743 07/13/18 0559       Assessment/Plan Recurrent nausea vomiting with incarcerated hiatal hernia presumed to be the etiology - Plan for OR today for Laparoscopic Hiatal Hernia repair with Dr. Kae Heller  - Continue NPO, NGT for now - Mobilize   FEN - NPO, NGT VTE - SCD, Lovenox ID - Ancef peri-op   LOS: 3 days    Jillyn Ledger , [facility] 07/12/2018, 9:54 AM Pager: [phone]

## 2018-07-12 NOTE — Anesthesia Procedure Notes (Signed)
Procedure Name: Intubation Date/Time: 07/12/2018 1:46 PM Performed by: Lavina Hamman, CRNA Pre-anesthesia Checklist: Patient identified, Emergency Drugs available, Suction available and Patient being monitored Patient Re-evaluated:Patient Re-evaluated prior to induction Oxygen Delivery Method: Circle System Utilized Preoxygenation: Pre-oxygenation with 100% oxygen Induction Type: IV induction, Rapid sequence and Cricoid Pressure applied Laryngoscope Size: Mac and 3 Grade View: Grade I Tube type: Oral Tube size: 7.5 mm Number of attempts: 1 Airway Equipment and Method: Stylet and Oral airway Placement Confirmation: ETT inserted through vocal cords under direct vision,  positive ETCO2 and breath sounds checked- equal and bilateral Secured at: 21 cm Tube secured with: Tape Dental Injury: Teeth and Oropharynx as per pre-operative assessment  Comments: ATOI, NG suctioned prior to induction.

## 2018-07-12 NOTE — Op Note (Signed)
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 with antrum and proximal duodenum herniated into the chest Post-op diagnosis/intraop findings: same, incarcerated infarcted omentum which was densely adherent to the hernia sac, inflammatory adhesions, seroma type fluid in the hernia sac  Specimens: none Retained items: 19 French round Blake drain in the mediastinum, exit the right paramedian abdominal wall EBL: 18HU Complications: none  Description of procedure: After obtaining informed consent the patient was taken to the operating room and placed supine on operating room table wheregeneral endotracheal anesthesia was initiated, preoperative antibiotics were administered, SCDs applied, and a formal timeout was performed.  Foley catheter was inserted.  The abdomen was prepped and draped in the usual sterile fashion. The peritoneal cavity was entered with an Optiview technique in the left upper quadrant and insufflated to 33mHg. Gross inspection revealed no evidence of injury from entry. Bilateral TAPS blocks were performed under laparoscopic visualization using exparel.  The remaining trochars were inserted under direct visualization including a left lateral and a left paramedian 5 mm trocar, right paramedian 11 mm trocar and a right lateral 5 mm trocar.  A subxiphoid incision was made and the liver retractor introduced for fixed retraction of the left lobe.  The distal stomach and duodenum reduced spontaneously but a large hernia defect was noted with incarcerated omentum going anteriorly.    Began the dissection by dividing the pars lucida and  delineating the right crus.  This proceeded anteriorly until just past midline, where there was dense, fibrotic inflammatory adhesion of sac and incarcerated omentum which somewhat obliterated the plane of dissection.  At this juncture we turned to the greater curvature and began taking down the short gastrics with the harmonic scalpel.  The incarcerated omentum was amputated to allow uKoreato further expose the greater curvature in the left crus.  Again on this side the sac is extremely thickened but with careful dissection we were able to delineate it.  We then proceeded anteriorly until he met the area of dense firm scar, and proceeded to carefully dissect through this until we are able to establish a plane and ultimately we were able to reduce an enormous sac from the mediastinum which contained a small amount of seroma fluid as well as some infarcted chronically incarcerated omentum, which was removed from the abdomen with an Endo Catch bag.  Once we had the sac fully reduced, we continue to divide mediastinal attachments of the esophagus until we had 3 to 4 cm of esophagus in the abdomen without tension.  The vagus nerves were visualized and care was taken to preserve them.  There really was not a good plane between the esophagus and the aorta, and gentle blunt dissection was employed here to get adequate esophageal length.  We paused at this point for Dr. WRedmond Pullingto perform an endoscopy, confirming the location of the gastroesophageal junction.  I then had the anesthetist pass a 57French lighted bougie which traversed into the stomach easily.  With the bougie in place, the hernia sac was bisected and then each side removed using the harmonic scalpel.  Again confirmed no injury to the vagus nerves while doing this.  The sac was removed from the abdomen and discarded.  The bougie was retracted into the proximal esophagus and we proceeded to the posterior aspect of the stomach where there were a few remaining  short gastrics and posterior adhesions which were divided to make enough room for the wrap.  The hiatus was closed using 4 interrupted 0 Ethibond sutures secured with ty-knots. This narrowed the hiatus nicely to about 2-1/2 cm in diameter. The 29 French lighted bougie was then re-introduced under direct visualization and the fundus of the stomach was grasped from the right side and pulled to wrap around the distal esophagus. The wrap was completed with 3 interrupted sutures of 0 Ethibond (again secured with ty-knots), the middle suture grabbing a bite of esophageal wall. The final wrap was about 2 cm in length and easily accommodated a grasper even with the bougie in place. The bougie was removed and our wrap inspected it sat nicely within the abdomen without any tension. The suture line sat at about 11:00. The abdomen was then inspected for hemostasis which was found to be adequate. No evidence of other injuries.  The stomach appeared healthy and with normal tone.  A 19 French round Blake drain was inserted through the 11 mm trocar site and directed up into the mediastinum.  This was secured to the skin with a 2-0 nylon.  The liver retractor was removed under direct visualization. The abdomen was then desufflated and all trochars removed. Our skin incisions were closed with running subcuticular Monocryl after infiltrating with the remaining local. Dermabond was then applied. The patient was then awakened, extubated and taken to PACU in stable condition.   All counts were correct at the completion of the case.

## 2018-07-12 NOTE — Transfer of Care (Signed)
Immediate Anesthesia Transfer of Care Note  Patient: Crystalmarie Yasin Kimmer  Procedure(s) Performed: LAPAROSCOPIC PARAESOPHOGEAL HERNIA (N/A ) UPPER ENDOSCOPY (N/A )  Patient Location: PACU  Anesthesia Type:General  Level of Consciousness: awake, alert , oriented and patient cooperative  Airway & Oxygen Therapy: Patient Spontanous Breathing and Patient connected to face mask oxygen  Post-op Assessment: Report given to RN, Post -op Vital signs reviewed and stable and Patient moving all extremities  Post vital signs: Reviewed and stable  Last Vitals:  Vitals Value Taken Time  BP 153/88 07/12/18 1632  Temp    Pulse    Resp 17 07/12/18 1633  SpO2    Vitals shown include unvalidated device data.  Last Pain:  Vitals:   07/12/18 0800  TempSrc:   PainSc: 4       Patients Stated Pain Goal: 2 (21/94/71 2527)  Complications: No apparent anesthesia complications

## 2018-07-12 NOTE — Anesthesia Preprocedure Evaluation (Signed)
Anesthesia Evaluation  Patient identified by MRN, date of birth, ID band Patient awake    Reviewed: Allergy & Precautions, NPO status , Patient's Chart, lab work & pertinent test results  Airway Mallampati: II  TM Distance: >3 FB Neck ROM: Full    Dental  (+) Teeth Intact, Dental Advisory Given   Pulmonary    breath sounds clear to auscultation       Cardiovascular hypertension,  Rhythm:Regular Rate:Normal     Neuro/Psych    GI/Hepatic   Endo/Other    Renal/GU      Musculoskeletal   Abdominal (+) + obese,   Peds  Hematology   Anesthesia Other Findings   Reproductive/Obstetrics                             Anesthesia Physical Anesthesia Plan  ASA: III  Anesthesia Plan: General   Post-op Pain Management:    Induction: Intravenous  PONV Risk Score and Plan: Ondansetron and Dexamethasone  Airway Management Planned: Oral ETT  Additional Equipment:   Intra-op Plan:   Post-operative Plan: Extubation in OR  Informed Consent: I have reviewed the patients History and Physical, chart, labs and discussed the procedure including the risks, benefits and alternatives for the proposed anesthesia with the patient or authorized representative who has indicated his/her understanding and acceptance.     Dental advisory given  Plan Discussed with: CRNA and Anesthesiologist  Anesthesia Plan Comments:         Anesthesia Quick Evaluation  

## 2018-07-12 NOTE — Anesthesia Postprocedure Evaluation (Signed)
Anesthesia Post Note  Patient: Chanae Gemma Ruz  Procedure(s) Performed: LAPAROSCOPIC PARAESOPHOGEAL HERNIA (N/A ) UPPER ENDOSCOPY (N/A )     Patient location during evaluation: PACU Anesthesia Type: General Level of consciousness: awake and alert Pain management: pain level controlled Vital Signs Assessment: post-procedure vital signs reviewed and stable Respiratory status: spontaneous breathing, nonlabored ventilation, respiratory function stable and patient connected to nasal cannula oxygen Cardiovascular status: blood pressure returned to baseline and stable Postop Assessment: no apparent nausea or vomiting Anesthetic complications: no    Last Vitals:  Vitals:   07/12/18 1747 07/12/18 1840  BP: (!) 177/97 (!) 169/82  Pulse: 73 73  Resp: 12 17  Temp: 37.1 C 37.1 C  SpO2: 96% 97%    Last Pain:  Vitals:   07/12/18 1840  TempSrc: Oral  PainSc:                  Ryan P Ellender

## 2018-07-13 ENCOUNTER — Encounter (HOSPITAL_COMMUNITY): Payer: Self-pay | Admitting: Surgery

## 2018-07-13 ENCOUNTER — Inpatient Hospital Stay (HOSPITAL_COMMUNITY): Payer: POS

## 2018-07-13 LAB — BASIC METABOLIC PANEL
Anion gap: 11 (ref 5–15)
BUN: 8 mg/dL (ref 6–20)
CO2: 24 mmol/L (ref 22–32)
Calcium: 8.2 mg/dL — ABNORMAL LOW (ref 8.9–10.3)
Chloride: 102 mmol/L (ref 98–111)
Creatinine, Ser: 0.56 mg/dL (ref 0.44–1.00)
GFR calc Af Amer: >60 mL/min (ref >60)
GFR calc non Af Amer: >60 mL/min (ref >60)
Glucose, Bld: 111 mg/dL — ABNORMAL HIGH (ref 70–99)
Potassium: 3.9 mmol/L (ref 3.5–5.1)
Sodium: 137 mmol/L (ref 135–145)

## 2018-07-13 LAB — MAGNESIUM: Magnesium: 2 mg/dL (ref 1.7–2.4)

## 2018-07-13 LAB — CBC
HCT: 33.3 % — ABNORMAL LOW (ref 36.0–46.0)
Hemoglobin: 10.3 g/dL — ABNORMAL LOW (ref 12.0–15.0)
MCH: 27.1 pg (ref 26.0–34.0)
MCHC: 30.9 g/dL (ref 30.0–36.0)
MCV: 87.6 fL (ref 80.0–100.0)
Platelets: 249 10*3/uL (ref 150–400)
RBC: 3.8 MIL/uL — ABNORMAL LOW (ref 3.87–5.11)
RDW: 13.2 % (ref 11.5–15.5)
WBC: 10.5 10*3/uL (ref 4.0–10.5)
nRBC: 0 % (ref 0.0–0.2)

## 2018-07-13 MED ORDER — GABAPENTIN 300 MG PO CAPS: 300.0000 mg | ORAL_CAPSULE | Freq: Three times a day (TID) | ORAL | 1 refills | Status: AC

## 2018-07-13 MED ORDER — LORAZEPAM 2 MG/ML IJ SOLN: 0.5000 mg | Freq: Three times a day (TID) | INTRAMUSCULAR | Status: DC | PRN

## 2018-07-13 MED ORDER — LORATADINE 10 MG PO TABS
10.0000 mg | ORAL_TABLET | Freq: Every day | ORAL | Status: DC
  Administered 2018-07-13: 10 mg via ORAL
  Filled 2018-07-13: qty 1

## 2018-07-13 MED ORDER — PANTOPRAZOLE SODIUM 40 MG PO TBEC
40.0000 mg | DELAYED_RELEASE_TABLET | Freq: Every day | ORAL | Status: DC
  Administered 2018-07-13: 10:00:00 40 mg via ORAL
  Filled 2018-07-13: qty 1

## 2018-07-13 MED ORDER — MONTELUKAST SODIUM 10 MG PO TABS: 10.0000 mg | ORAL_TABLET | Freq: Every day | ORAL | Status: DC

## 2018-07-13 MED ORDER — DOCUSATE SODIUM 100 MG PO CAPS: 100.0000 mg | ORAL_CAPSULE | Freq: Two times a day (BID) | ORAL | 0 refills | Status: AC | PRN

## 2018-07-13 MED ORDER — TRAMADOL HCL 50 MG PO TABS: 50.0000 mg | ORAL_TABLET | Freq: Four times a day (QID) | ORAL | 0 refills | Status: AC | PRN

## 2018-07-13 MED ORDER — IOHEXOL 300 MG/ML  SOLN
60.0000 mL | Freq: Once | INTRAMUSCULAR | Status: AC | PRN
  Administered 2018-07-13: 12:00:00 60 mL via ORAL

## 2018-07-13 MED ORDER — METHOCARBAMOL 500 MG PO TABS: 500.0000 mg | ORAL_TABLET | Freq: Four times a day (QID) | ORAL | Status: DC | PRN

## 2018-07-13 MED ORDER — ONDANSETRON HCL 4 MG PO TABS: 4.0000 mg | ORAL_TABLET | Freq: Four times a day (QID) | ORAL | 1 refills | Status: AC | PRN

## 2018-07-13 MED ORDER — PANTOPRAZOLE SODIUM 40 MG PO TBEC: 40.0000 mg | DELAYED_RELEASE_TABLET | Freq: Every day | ORAL | 0 refills | Status: AC

## 2018-07-13 MED ORDER — LOSARTAN POTASSIUM 50 MG PO TABS: 100.0000 mg | ORAL_TABLET | Freq: Every day | ORAL | Status: DC

## 2018-07-13 NOTE — Plan of Care (Signed)
Pt was discharged home today. Instructions were reviewed with patient, and questions were answered. Pt was taken to main entrance via wheelchair by NT.  

## 2018-07-13 NOTE — Progress Notes (Addendum)
1 Day Post-Op  Subjective: CC: no complaints  Feels well this morning. Having some left shoulder pain from the gas. Minimal abdominal pain from incisions. Mild nausea is well controlled. No emesis or wretching. No dysphagia or sensation of sticking. Tolerating sips of clears. Has been walking.   Received tramadol x 1 and zofran x 1 last evening. On scheduled tylenol, gabapentin, reglan, ppi  Objective: Vital signs in last 24 hours: Temp:  [98.7 F (37.1 C)-99.1 F (37.3 C)] 98.9 F (37.2 C) (06/23 0626) Pulse Rate:  [69-100] 88 (06/23 0626) Resp:  [10-18] 18 (06/23 0626) BP: (140-177)/(76-98) 145/88 (06/23 0626) SpO2:  [92 %-100 %] 93 % (06/23 0626) Last BM Date: 07/09/18  Intake/Output from previous day: 06/22 0701 - 06/23 0700 In: 3591 [P.O.:300; I.V.:3191; IV Piggyback:100] Out: 1600 [Urine:1450; Drains:50; Blood:100] Intake/Output this shift: No intake/output data recorded.  PE: GeN: Awake and alert, NAD Heart: Regular Lungs: CTA b/l, normal effort and rate Abd: Soft, ND, minimally tender around incisions which are c/d/i with dermabond. JP output serosanguinous and low volume.  Msk: No edema.  Lab Results:  Recent Labs    07/12/18 0337 07/13/18 0452  WBC 6.6 10.5  HGB 10.9* 10.3*  HCT 35.9* 33.3*  PLT 249 249   BMET Recent Labs    07/12/18 0337 07/13/18 0452  NA  --  137  K 3.8 3.9  CL  --  102  CO2  --  24  GLUCOSE  --  111*  BUN  --  8  CREATININE 0.55 0.56  CALCIUM  --  8.2*   PT/INR No results for input(s): LABPROT, INR in the last 72 hours. CMP     Component Value Date/Time   NA 137 07/13/2018 0452   K 3.9 07/13/2018 0452   CL 102 07/13/2018 0452   CO2 24 07/13/2018 0452   GLUCOSE 111 (H) 07/13/2018 0452   BUN 8 07/13/2018 0452   CREATININE 0.56 07/13/2018 0452   CREATININE 0.66 06/04/2015 1354   CALCIUM 8.2 (L) 07/13/2018 0452   PROT 8.1 07/09/2018 1740   ALBUMIN 4.4 07/09/2018 1740   AST 21 07/09/2018 1740   ALT 22 07/09/2018  1740   ALKPHOS 88 07/09/2018 1740   BILITOT 0.5 07/09/2018 1740   GFRNONAA >60 07/13/2018 0452   GFRNONAA >89 06/04/2015 1354   GFRAA >60 07/13/2018 0452   GFRAA >89 06/04/2015 1354   Lipase     Component Value Date/Time   LIPASE 36 07/10/2018 0441       Studies/Results: Dg Abd Acute 2+v W 1v Chest  Result Date: 07/11/2018 CLINICAL DATA:  Hiatal hernia with obstruction.  NG tube placement. EXAM: DG ABDOMEN ACUTE W/ 1V CHEST COMPARISON:  Chest x-ray 06/04/2018 and KUB 07/10/2018, CT 07/09/2018 FINDINGS: Lungs are adequately inflated with minimal linear density left base likely atelectasis. There is retrocardiac opacification compatible patient's known hiatal hernia. Cardiomediastinal silhouette and remainder of the chest is unchanged. Nasogastric tube has tip just right of midline in the mid abdomen likely over the distal stomach. Bowel gas pattern is nonobstructive. Surgical clips over the right upper quadrant. Remainder the exam is unchanged. IMPRESSION: Nonobstructive bowel gas pattern. Nasogastric tube with tip right of midline in the mid abdomen likely over the distal stomach. Minimal linear atelectasis left base. Moderate size hiatal hernia. Electronically Signed   By: Marin Olp M.D.   On: 07/11/2018 13:09    Anti-infectives: Anti-infectives (From admission, onward)   Start     Dose/Rate Route  Frequency Ordered Stop   07/12/18 0745  ceFAZolin (ANCEF) IVPB 2g/100 mL premix     2 g 200 mL/hr over 30 Minutes Intravenous On call to O.R. 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 Can advance to  fulls then puree diet after films -Continue to mobilize as able -Continue aggressive pulmonary toilet -Stop IVF -Continue multimodal pain control (MINIMIZE NARCOTICS) and aggressive nausea prevention/control  FEN - CLD VTE - SCD, Lovenox ID - Ancef peri-op   Possible discharge this afternoon, will remove drain in clinic early next week   LOS: 4 days    Clovis Riley, MD [facility] 07/13/2018, 8:27 AM

## 2018-07-13 NOTE — Discharge Summary (Signed)
[facility] Discharge Summary   Patient ID: Theresa Gilbert MRN: [id number] DOB/AGE: 55-24-1967 55 y.o.  Admit date: 07/09/2018 Discharge date: 07/13/2018  Admitting Diagnosis: Recurrent nausea and vomiting with chronic hiatal hernia.  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  Discharge Diagnosis Patient Active Problem List   Diagnosis Date Noted  . Urinary incontinence 07/09/2018  . Incarcerated hiatal hernia 07/09/2018  . Hiatal hernia with obstruction but no gangrene 06/01/2018  . OSA (obstructive sleep apnea) 11/03/2017  . Polyp of colon 09/25/2017  . Chronic rhinitis 06/04/2015  . Snoring 05/14/2015  . Morbid obesity ([place]) 05/25/2014  . Benign essential hypertension 07/16/2013  . GERD (gastroesophageal reflux disease) 07/16/2013  . 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 Incisional hernia 07/16/2013  . Insomnia 07/16/2013  . Primary insomnia 07/16/2013  . Perennial allergic rhinitis with seasonal variation 07/16/2013  . Palpitations 07/16/2013  . Ventral hernia 07/16/2013    Consultants None  Imaging: Dg Ugi W Single Cm (sol Or Thin Ba)  Result Date: 07/13/2018 CLINICAL DATA:  53 year old female postoperative day 1 from surgical repair of hiatal hernia and Nissen fundoplication. Tolerating p.o. liquids. EXAM: WATER SOLUBLE UPPER GI SERIES TECHNIQUE: Single-column upper GI series was performed using water soluble contrast. CONTRAST:  61mL OMNIPAQUE IOHEXOL 300 MG/ML  SOLN COMPARISON:  CT Abdomen and Pelvis 07/09/2018 FLUOROSCOPY TIME:  Fluoroscopy Time:  1 minutes 6 seconds Radiation Exposure Index (if provided by the fluoroscopic device): Number of Acquired Spot Images: 0 FINDINGS: A single contrast water soluble study was undertaken and the patient tolerated this well and without difficulty. A looped surgical drain is present at the level of the diaphragm and gastroesophageal junction. No obstruction to the forward flow of contrast  throughout the esophagus and into the stomach. Satisfactory postoperative appearance of the gastroesophageal junction, with no significant delay in contrast passage. No leak or abnormal accumulation of contrast occurred. No residual gastric hernia is evident. Mild gastric distention with air. Stable cholecystectomy clips. IMPRESSION: Satisfactory postoperative appearance following gastric hernia repair and fundoplication. Electronically Signed   By: Genevie Ann M.D.   On: 07/13/2018 12:14   Procedures Dr. Kae Heller (07/13/18) - Laparoscopic paraesophageal hernia repair, Nissen Fundoplication  H&P: Pleasant obese woman who has had a hiatal hernia for quite some time.  Developed sharp chest pain and nausea vomiting and was admitted last month.  Nasogastric tube placed.  Had relief of symptoms.  Tolerated nasogastric clamping trial was advanced on diet.  No evidence of bowel obstruction or any other major intra-abdominal pathology.  Presumption of etiology nausea vomiting related to her moderate sized hiatal hernia.  Held off on emergent hiatal hernia surgery.  Able to be discharged home on soft diet without recurrent symptoms.  Followed up with Dr. Kae Heller with our office 2 weeks ago and seemed to be doing better.  Plan was for the patient to try and lose some weight to have a better long-term success rate with any hiatal hernia repair.  Unfortunately she has had recurrent nausea vomiting and abdominal and chest pain this week.  Had a more severe episode today.  Pain became unbearable.  N/V.  Could not keep anything down.  Was concerned.  Came to the emergency room Friday night.  CT scan confirms hiatal hernia with about half of the stomach up in the chest.  Some mild stomach wall thickening.  No other major intra-abdominal pathologies.  Liver mass stable from 2011with history of presumed hemangioma.  She has a history of cholecystectomy  in the past with resulting periumbilical incisional hernia  Hospital Course:   Theresa Gilbert is a 53 y.o. female who presented to [facility] and was admitted to the surgical team as above. An NG tube was placed on admission. The patient failed non operative management. She was taken to the OR by Dr. Kae Heller and underwent procedure listed as above. The patient tolerated procedure well and was transferred to the floor. UGI on POD 1 satisfactory. Diet was advanced to pureed diet which the patient tolerated. On POD1, the patient was voiding well, tolerating pureed diet, having bowel function (small BM this afternoon and passing flatus), ambulating well, pain well controlled, vital signs stable, incisions c/d/i and felt stable for discharge home. Her drain was left in place and she is scheduled to have follow up for this in 1 week. A note was provided for work.  Patient will follow up as below and knows to call with questions or concerns.    I have personally reviewed the patients medication history on the [place] controlled substance database.   Physical Exam: General:  Alert, NAD, pleasant, comfortable Pulm: effort normal Abd:  Soft, ND, appropriately tender, multiple lap incisions C/D/I, drain with minimal serosanguinous drainage, +BS  Allergies as of 07/13/2018   No Known Allergies     Medication List    TAKE these medications   acetaminophen 325 MG tablet Commonly known as: TYLENOL Take 2 tablets (650 mg total) by mouth every 6 (six) hours as needed for mild pain, fever or headache.   CALCIUM + D PO Take 1 tablet by mouth daily.   calcium-vitamin D 500-200 MG-UNIT tablet Commonly known as: OSCAL WITH D Take 1 tablet by mouth daily.   cetirizine 5 MG tablet Commonly known as: ZYRTEC Take 5 mg by mouth daily.   docusate sodium 100 MG capsule Commonly known as: COLACE Take 1 capsule (100 mg total) by mouth 2 (two) times daily as needed for mild constipation.   esomeprazole 20 MG capsule Commonly known as: NEXIUM Take 20 mg by mouth 2 (two) times daily.    fluticasone 50 MCG/ACT nasal spray Commonly known as: FLONASE Place 1 spray into both nostrils daily.   gabapentin 300 MG capsule Commonly known as: NEURONTIN Take 1 capsule (300 mg total) by mouth 3 (three) times daily.   ibuprofen 200 MG tablet Commonly known as: ADVIL Take 200 mg by mouth daily as needed for headache or moderate pain.   losartan 100 MG tablet Commonly known as: COZAAR Take 100 mg by mouth at bedtime.   montelukast 10 MG tablet Commonly known as: SINGULAIR Take 10 mg by mouth at bedtime.   MULTIVITAMIN GUMMIES ADULTS PO Take 1 tablet by mouth daily.   ondansetron 4 MG tablet Commonly known as: ZOFRAN Take 1 tablet (4 mg total) by mouth every 6 (six) hours as needed for nausea or vomiting.   pantoprazole 40 MG tablet Commonly known as: PROTONIX Take 1 tablet (40 mg total) by mouth daily. Start taking on: July 14, 2018   sucralfate 1 g tablet Commonly known as: Carafate Take 1 tablet (1 g total) by mouth 4 (four) times daily -  with meals and at bedtime for 7 days.   traMADol 50 MG tablet Commonly known as: ULTRAM Take 1 tablet (50 mg total) by mouth every 6 (six) hours as needed for severe pain.   VITAMIN B-12 PO Take 1 tablet by mouth daily.        Follow-up Information  [facility], [place] Follow up on 07/20/2018.   Specialty: General Surgery Why: 6/30 at 2pm. Please arrive 30 minutes prior to your appointment for paperwork and check in. Please bring you insurance card and photo ID  Contact information: [address] [place] [address] [phone]        Clovis Riley, MD. Call on 08/04/2018.   Specialty: General Surgery Why: 7/15 at 10:20am. Please arrive 20 minutes early. Please bring a copy of your photo ID and insurance card.  Contact information: [address] [place] [address] [phone]           Signed: Jillyn Ledger, [facility] 07/13/2018,  3:38 PM Pager: [phone]

## 2018-07-13 NOTE — Discharge Instructions (Signed)
EATING AFTER YOUR ESOPHAGEAL SURGERY (Stomach Fundoplication, Hiatal Hernia repair, Achalasia surgery, etc)  ######################################################################  EAT Start with a pureed / full liquid diet (see below) Gradually transition to a high fiber diet with a fiber supplement over the next month after discharge.    WALK Walk an hour a day.  Control your pain to do that.    CONTROL PAIN Control pain so that you can walk, sleep, tolerate sneezing/coughing, go up/down stairs.  HAVE A BOWEL MOVEMENT DAILY Keep your bowels regular to avoid problems.  OK to try a laxative to override constipation.  OK to use an antidairrheal to slow down diarrhea.  Call if not better after 2 tries  CALL IF YOU HAVE PROBLEMS/CONCERNS Call if you are still struggling despite following these instructions. Call if you have concerns not answered by these instructions  ######################################################################   After your esophageal surgery, expect some sticking with swallowing over the next 1-2 months.    If food sticks when you eat, it is called "dysphagia".  This is due to swelling around your esophagus at the wrap & hiatal diaphragm repair.  It will gradually ease off over the next few months.  To help you through this temporary phase, we start you out on a pureed (blenderized) diet.  Your first meal in the hospital was thin liquids.  You should have been given a pureed diet by the time you left the hospital.  We ask patients to stay on a pureed diet for the first 2-3 weeks to avoid anything getting "stuck" near your recent surgery.  Don't be alarmed if your ability to swallow doesn't progress according to this plan.  Everyone is different and some diets can advance more or less quickly.     Some BASIC RULES to follow are:  Maintain an upright position whenever eating or drinking.  Take small bites - just a teaspoon size bite at a time.  Eat slowly.   It may also help to eat only one food at a time.  Consider nibbling through smaller, more frequent meals & avoid the urge to eat BIG meals  Do not push through feelings of fullness, nausea, or bloatedness  Do not mix solid foods and liquids in the same mouthful  Try not to "wash foods down" with large gulps of liquids.  Avoid carbonated (bubbly/fizzy) drinks.    Avoid foods that make you feel gassy or bloated.  Start with bland foods first.  Wait on trying greasy, fried, or spicy meals until you are tolerating more bland solids well.  Understand that it will be hard to burp and belch at first.  This gradually improves with time.  Expect to be more gassy/flatulent/bloated initially.  Walking will help your body manage it better.  Consider using medications for bloating that contain simethicone such as  Maalox or Gas-X   Eat in a relaxed atmosphere & minimize distractions.  Avoid talking while eating.    Do not use straws.  Following each meal, sit in an upright position (90 degree angle) for 60 to 90 minutes.  Going for a short walk can help as well  If food does stick, don't panic.  Try to relax and let the food pass on its own.  Sipping WARM LIQUID such as strong hot black tea can also help slide it down.   Be gradual in changes & use common sense:  -If you easily tolerating a certain "level" of foods, advance to the next level gradually -If you are  having trouble swallowing a particular food, then avoid it.   -If food is sticking when you advance your diet, go back to thinner previous diet (the lower LEVEL) for 1-2 days.  LEVEL 1 = PUREED DIET  Do for the first 2 WEEKS AFTER SURGERY  -Foods in this group are pureed or blenderized to a smooth, mashed potato-like consistency.  -If necessary, the pureed foods can keep their shape with the addition of a thickening agent.   -Meat should be pureed to a smooth, pasty consistency.  Hot broth or gravy may be added to the pureed  meat, approximately 1 oz. of liquid per 3 oz. serving of meat. -CAUTION:  If any foods do not puree into a smooth consistency, swallowing will be more difficult.  (For example, nuts or seeds sometimes do not blend well.)  Hot Foods Cold Foods  Pureed scrambled eggs and cheese Pureed cottage cheese  Baby cereals Thickened juices and nectars  Thinned cooked cereals (no lumps) Thickened milk or eggnog  Pureed [place] toast or pancakes Ensure  Mashed potatoes Ice cream  Pureed parsley, au gratin, scalloped potatoes, candied sweet potatoes Fruit or [place] ice, sherbet  Pureed buttered or alfredo noodles Plain yogurt  Pureed vegetables (no corn or peas) Instant breakfast  Pureed soups and creamed soups Smooth pudding, mousse, custard  Pureed scalloped apples Whipped gelatin  Gravies Sugar, syrup, honey, jelly  Sauces, cheese, tomato, barbecue, white, creamed Cream  Any baby food Creamer  Alcohol in moderation (not beer or champagne) Margarine  Coffee or tea Mayonnaise   Ketchup, mustard   Apple sauce   SAMPLE MENU:  PUREED DIET Breakfast Lunch Dinner   Orange juice, 1/2 cup  Cream of wheat, 1/2 cup  Pineapple juice, 1/2 cup  Pureed [place], barley soup, 3/4 cup  Pureed Hawaiian chicken, 3 oz   Scrambled eggs, mashed or blended with cheese, 1/2 cup  Tea or coffee, 1 cup   Whole milk, 1 cup   Non-dairy creamer, 2 Tbsp.  Mashed potatoes, 1/2 cup  Pureed cooled broccoli, 1/2 cup  Apple sauce, 1/2 cup  Coffee or tea  Mashed potatoes, 1/2 cup  Pureed spinach, 1/2 cup  Frozen yogurt, 1/2 cup  Tea or coffee      LEVEL 2 = SOFT DIET  After your first 2 weeks, you can advance to a soft diet.   Keep on this diet until everything goes down easily.  Hot Foods Cold Foods  White fish Cottage cheese  Stuffed fish Junior baby fruit  Baby food meals Semi thickened juices  Minced soft cooked, scrambled, poached eggs nectars  Souffle & omelets Ripe mashed bananas  Cooked  cereals Canned fruit, pineapple sauce, milk  potatoes Milkshake  Buttered or Alfredo noodles Custard  Cooked cooled vegetable Puddings, including tapioca  Sherbet Yogurt  Vegetable soup or alphabet soup Fruit ice, [place] ice  Gravies Whipped gelatin  Sugar, syrup, honey, jelly Junior baby desserts  Sauces:  Cheese, creamed, barbecue, tomato, white Cream  Coffee or tea Margarine   SAMPLE MENU:  LEVEL 2 Breakfast Lunch Dinner   Orange juice, 1/2 cup  Oatmeal, 1/2 cup  Scrambled eggs with cheese, 1/2 cup  Decaffeinated tea, 1 cup  Whole milk, 1 cup  Non-dairy creamer, 2 Tbsp  Pineapple juice, 1/2 cup  Minced beef, 3 oz  Gravy, 2 Tbsp  Mashed potatoes, 1/2 cup  Minced fresh broccoli, 1/2 cup  Applesauce, 1/2 cup  Coffee, 1 cup  [place], barley soup, 3/4 cup  Minced Hawaiian chicken, 3 oz  Mashed potatoes, 1/2 cup  Cooked spinach, 1/2 cup  Frozen yogurt, 1/2 cup  Non-dairy creamer, 2 Tbsp      LEVEL 3 = CHOPPED DIET  -After all the foods in level 2 (soft diet) are passing through well you should advance up to more chopped foods.  -It is still important to cut these foods into small pieces and eat slowly.  Hot Foods Cold Foods  Poultry Cottage cheese  Chopped Swedish meatballs Yogurt  Meat salads (ground or flaked meat) Milk  Flaked fish (tuna) Milkshakes  Poached or scrambled eggs Soft, cold, dry cereal  Souffles and omelets Fruit juices or nectars  Cooked cereals Chopped canned fruit  Chopped [place] toast or pancakes Canned fruit cocktail  Noodles or pasta (no rice) Pudding, mousse, custard  Cooked vegetables (no frozen peas, corn, or mixed vegetables) Green salad  Canned small sweet peas Ice cream  Creamed soup or vegetable soup Fruit ice, [place] ice  Pureed vegetable soup or alphabet soup Non-dairy creamer  Ground scalloped apples Margarine  Gravies Mayonnaise  Sauces:  Cheese, creamed, barbecue, tomato, white Ketchup  Coffee or tea Mustard    SAMPLE MENU:  LEVEL 3 Breakfast Lunch Dinner   Orange juice, 1/2 cup  Oatmeal, 1/2 cup  Scrambled eggs with cheese, 1/2 cup  Decaffeinated tea, 1 cup  Whole milk, 1 cup  Non-dairy creamer, 2 Tbsp  Ketchup, 1 Tbsp  Margarine, 1 tsp  Salt, 1/4 tsp  Sugar, 2 tsp  Pineapple juice, 1/2 cup  Ground beef, 3 oz  Gravy, 2 Tbsp  Mashed potatoes, 1/2 cup  Cooked spinach, 1/2 cup  Applesauce, 1/2 cup  Decaffeinated coffee  Whole milk  Non-dairy creamer, 2 Tbsp  Margarine, 1 tsp  Salt, 1/4 tsp  Pureed [place], barley soup, 3/4 cup  Barbecue chicken, 3 oz  Mashed potatoes, 1/2 cup  Ground fresh broccoli, 1/2 cup  Frozen yogurt, 1/2 cup  Decaffeinated tea, 1 cup  Non-dairy creamer, 2 Tbsp  Margarine, 1 tsp  Salt, 1/4 tsp  Sugar, 1 tsp    LEVEL 4:  REGULAR FOODS  -Foods in this group are soft, moist, regularly textured foods.   -This level includes meat and breads, which tend to be the hardest things to swallow.   -Eat very slowly, chew well and continue to avoid carbonated drinks. -most people are at this level in 4-6 weeks  Hot Foods Cold Foods  Baked fish or skinned Soft cheeses - cottage cheese  Souffles and omelets Cream cheese  Eggs Yogurt  Stuffed shells Milk  Spaghetti with meat sauce Milkshakes  Cooked cereal Cold dry cereals (no nuts, dried fruit, coconut)  [place] toast or pancakes Crackers  Buttered toast Fruit juices or nectars  Noodles or pasta (no rice) Canned fruit  Potatoes (all types) Ripe bananas  Soft, cooked vegetables (no corn, lima, or baked beans) Peeled, ripe, fresh fruit  Creamed soups or vegetable soup Cakes (no nuts, dried fruit, coconut)  Canned chicken noodle soup Plain doughnuts  Gravies Ice cream  Bacon dressing Pudding, mousse, custard  Sauces:  Cheese, creamed, barbecue, tomato, white Fruit ice, [place] ice, sherbet  Decaffeinated tea or coffee Whipped gelatin  Pork chops Regular gelatin   Canned fruited  gelatin molds   Sugar, syrup, honey, jam, jelly   Cream   Non-dairy   Margarine   Oil   Mayonnaise   Ketchup   Mustard   TROUBLESHOOTING IRREGULAR BOWELS  1) Avoid extremes of bowel  movements (no bad constipation/diarrhea)  2) Miralax 17gm mixed in [place]. water or juice-daily. May use BID as needed.  3) Gas-x,Phazyme, etc. as needed for gas & bloating.  4) Soft,bland diet. No spicy,greasy,fried foods.  5) Prilosec over-the-counter as needed  6) May hold gluten/wheat products from diet to see if symptoms improve.  7) May try probiotics (Align, Activa, etc) to help calm the bowels down  7) If symptoms become worse call back immediately.    If you have any questions please call our office at [place]: [phone].    [facility] Surgical drains are used to remove extra fluid that normally builds up in a surgical wound after surgery. A surgical drain helps to heal a surgical wound. Different kinds of surgical drains include:  Active drains. These drains use suction to pull drainage away from the surgical wound. Drainage flows through a tube to a container outside of the body. It is important to keep the bulb or the drainage container flat (compressed) at all times, except while you empty it. Flattening the bulb or container creates suction. The two most common types of active drains are bulb drains and Hemovac drains.  Passive drains. These drains allow fluid to drain naturally, by gravity. Drainage flows through a tube to a bandage (dressing) or a container outside of the body. Passive drains do not need to be emptied. The most common type of passive drain is the Penrose drain. A drain is placed during surgery. Immediately after surgery, drainage is usually bright red and a little thicker than water. The drainage may gradually turn yellow or pink and become thinner. It is likely that your health care provider will remove the drain when the drainage stops or  when the amount decreases to 1-2 Tbsp (15-30 mL) during a 24-hour period. How to care for your surgical drain It is important to care for your drain to prevent infection. If your drain is placed at your back, or any other hard-to-reach area, ask another person to assist you in performing the following steps:  Keep the skin around the drain dry and covered with a dressing at all times.  Check your drain area every day for signs of infection. Check for: ? More redness, swelling, or pain. ? Pus or a bad smell. ? Cloudy drainage. Follow instructions from your health care provider about how to take care of your drain and how to change your dressing. Change your dressing at least one time every day. Change it more often if needed to keep the dressing dry. Make sure you: 1. Gather your supplies, including: ? Tape. ? Germ-free cleaning solution (sterile saline). ? Split gauze drain sponge: 4 x 4 inches (10 x 10 cm). ? Gauze square: 4 x 4 inches (10 x 10 cm). 2. Wash your hands with soap and water before you change your dressing. If soap and water are not available, use hand sanitizer. 3. Remove the old dressing. Avoid using scissors to do that. 4. Use sterile saline to clean your skin around the drain. 5. Place the tube through the slit in a drain sponge. Place the drain sponge so that it covers your wound. 6. Place the gauze square or another drain sponge on top of the drain sponge that is on the wound. Make sure the tube is between those layers. 7. Tape the dressing to your skin. 8. If you have an active bulb or Hemovac drain, tape the drainage tube to your skin 1-2 inches (  2.5-5 cm) below the place where the tube enters your body. Taping keeps the tube from pulling on any stitches (sutures) that you have. 9. Wash your hands with soap and water. 10. Write down the color of your drainage and how often you change your dressing. How to empty your active bulb or Hemovac drain  1. Make sure that you  have a measuring cup that you can empty your drainage into. 2. Wash your hands with soap and water. If soap and water are not available, use hand sanitizer. 3. Gently move your fingers down the tube while squeezing very lightly. This is called stripping the tube. This clears any drainage, clots, or tissue from the tube. ? Do not pull on the tube. ? You may need to strip the tube several times every day to keep the tube clear. 4. Open the bulb cap or the drain plug. Do not touch the inside of the cap or the bottom of the plug. 5. Empty all of the drainage into the measuring cup. 6. Compress the bulb or the container and replace the cap or the plug. To compress the bulb or the container, squeeze it firmly in the middle while you close the cap or plug the container. 7. Write down the amount of drainage that you have in each 24-hour period. If you have less than 2 Tbsp (30 mL) of drainage during 24 hours, contact your health care provider. 8. Flush the drainage down the toilet. 9. Wash your hands with soap and water. Contact a health care provider if:  You have more redness, swelling, or pain around your drain area.  The amount of drainage that you have is increasing instead of decreasing.  You have pus or a bad smell coming from your drain area.  You have a fever.  You have drainage that is cloudy.  There is a sudden stop or a sudden decrease in the amount of drainage that you have.  Your tube falls out.  Your active draindoes not stay compressedafter you empty it. Summary  Surgical drains are used to remove extra fluid that normally builds up in a surgical wound after surgery.  Different kinds of surgical drains include active drains and passive drains. Active drains use suction to pull drainage away from the surgical wound, and passive drains allow fluid to drain naturally.  It is important to care for your drain to prevent infection. If your drain is placed at your back, or any  other hard-to-reach area, ask another person to assist you.  Contact your health care provider if you have more redness, swelling, or pain around your drain area. This information is not intended to replace advice given to you by your health care provider. Make sure you discuss any questions you have with your health care provider. Document Released: 01/04/2000 Document Revised: 01/29/2017 Document Reviewed: 07/26/2014 Elsevier Interactive Patient Education  2019 [employer].

## 2020-09-13 ENCOUNTER — Other Ambulatory Visit (HOSPITAL_BASED_OUTPATIENT_CLINIC_OR_DEPARTMENT_OTHER): Payer: Self-pay | Admitting: Obstetrics and Gynecology

## 2020-09-13 DIAGNOSIS — Z1231 Encounter for screening mammogram for malignant neoplasm of breast: Secondary | ICD-10-CM

## 2020-09-25 ENCOUNTER — Encounter (HOSPITAL_BASED_OUTPATIENT_CLINIC_OR_DEPARTMENT_OTHER): Payer: Self-pay

## 2020-09-25 ENCOUNTER — Other Ambulatory Visit: Payer: Self-pay

## 2020-09-25 ENCOUNTER — Ambulatory Visit (HOSPITAL_BASED_OUTPATIENT_CLINIC_OR_DEPARTMENT_OTHER)
Admission: RE | Admit: 2020-09-25 | Discharge: 2020-09-25 | Disposition: A | Discharge status: Home / Self Care | Payer: 59 | Source: Ambulatory Visit | Attending: Obstetrics and Gynecology | Admitting: Obstetrics and Gynecology

## 2020-09-25 DIAGNOSIS — Z1231 Encounter for screening mammogram for malignant neoplasm of breast: Secondary | ICD-10-CM | POA: Insufficient documentation

## 2020-11-15 ENCOUNTER — Encounter (HOSPITAL_BASED_OUTPATIENT_CLINIC_OR_DEPARTMENT_OTHER): Payer: Self-pay

## 2020-11-15 ENCOUNTER — Other Ambulatory Visit: Payer: Self-pay

## 2020-11-15 ENCOUNTER — Emergency Department (HOSPITAL_BASED_OUTPATIENT_CLINIC_OR_DEPARTMENT_OTHER)
Admission: EM | Admit: 2020-11-15 | Discharge: 2020-11-15 | Disposition: A | Discharge status: Home / Self Care | Payer: POS | Attending: Student | Admitting: Student

## 2020-11-15 DIAGNOSIS — W540XXA Bitten by dog, initial encounter: Secondary | ICD-10-CM | POA: Diagnosis not present

## 2020-11-15 DIAGNOSIS — Z23 Encounter for immunization: Secondary | ICD-10-CM | POA: Insufficient documentation

## 2020-11-15 DIAGNOSIS — S301XXA Contusion of abdominal wall, initial encounter: Secondary | ICD-10-CM | POA: Insufficient documentation

## 2020-11-15 DIAGNOSIS — I10 Essential (primary) hypertension: Secondary | ICD-10-CM | POA: Diagnosis not present

## 2020-11-15 DIAGNOSIS — S3991XA Unspecified injury of abdomen, initial encounter: Secondary | ICD-10-CM | POA: Diagnosis present

## 2020-11-15 DIAGNOSIS — Z2914 Encounter for prophylactic rabies immune globin: Secondary | ICD-10-CM | POA: Diagnosis not present

## 2020-11-15 DIAGNOSIS — Z79899 Other long term (current) drug therapy: Secondary | ICD-10-CM | POA: Insufficient documentation

## 2020-11-15 MED ORDER — RABIES IMMUNE GLOBULIN 150 UNIT/ML IM INJ
20.0000 [IU]/kg | INJECTION | Freq: Once | INTRAMUSCULAR | Status: AC
Start: 2020-11-15 — End: 2020-11-15
  Administered 2020-11-15: 2250 [IU] via INTRAMUSCULAR
  Filled 2020-11-15: qty 16

## 2020-11-15 MED ORDER — RABIES VACCINE, PCEC IM SUSR
1.0000 mL | Freq: Once | INTRAMUSCULAR | Status: AC
Start: 2020-11-15 — End: 2020-11-15
  Administered 2020-11-15: 1 mL via INTRAMUSCULAR
  Filled 2020-11-15: qty 1

## 2020-11-15 NOTE — ED Triage Notes (Signed)
Pt arrives with reports of being bitten by a dog yesterday states she was walking on the [place] [address], passed someone with 2 dogs on a leash when one of the dogs turned and attacked her biting her left side. She initially thought the dog did not puncture, states when she got home she realized she did have a small puncture wound, no bleeding, and scratches with bruising. They did not get any information from the owner about dogs. Area was cleaned with alcohol and neosporin.

## 2020-11-15 NOTE — ED Provider Notes (Signed)
[facility] EMERGENCY DEPARTMENT Provider Note   CSN: [id number] Arrival date & time: 11/15/20  0844     History Chief Complaint  Patient presents with   Animal Bite    Theresa Gilbert is a 55 y.o. female.  55 y.o female with a PMH of HTN to the ED with a chief complaint of dog bite x 1 day.  According to patient, she was walking down the [place], when she suddenly was attacked by a small dog, she was bit on the left lower quadrant of her abdomen, reports there was no blood at the time of the event.  She did not notice any cage in the skin, however when arriving at home, she noted that her underwear was torn, she has irrigated this along with cleaned it with alcohol.  Does report her last tetanus immunization was 4 years ago.  She went to urgent care this morning, given a prescription for an antibiotic, however she arrives today in order to obtain the rabies vaccine series.  According to patient, she is unsure of the dog's vaccination status therefore she would like to seek treatment.  She denies any other injury, or other complaints.  The history is provided by the patient.  Animal Bite Contact animal:  Dog Location:  [place]so Torso injury location:  Abd LLQ Time since incident:  1 day Associated symptoms: no fever       Past Medical History:  Diagnosis Date   GERD (gastroesophageal reflux disease)    Hypertension    Sleep apnea     Patient Active Problem List   Diagnosis Date Noted   Urinary incontinence 07/09/2018   Incarcerated hiatal hernia 07/09/2018   Hiatal hernia with obstruction but no gangrene 06/01/2018   OSA (obstructive sleep apnea) 11/03/2017   Polyp of colon 09/25/2017   Chronic rhinitis 06/04/2015   Snoring 05/14/2015   Morbid obesity ([place]) 05/25/2014   Benign essential hypertension 07/16/2013   GERD (gastroesophageal reflux disease) 07/16/2013   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   Incisional hernia 07/16/2013   Insomnia 07/16/2013   Primary  insomnia 07/16/2013   Perennial allergic rhinitis with seasonal variation 07/16/2013   Palpitations 07/16/2013   Ventral hernia 07/16/2013    Past Surgical History:  Procedure Laterality Date   arthritic cyst Left    wrist   CHOLECYSTECTOMY     GANGLION CYST EXCISION Left    wrist,   LAPAROSCOPIC NISSEN FUNDOPLICATION N/A 08/30/9145   Procedure: LAPAROSCOPIC [place];  Surgeon: Clovis Riley, MD;  Location: [facility];  Service: General;  Laterality: N/A;   TONSILLECTOMY     TURBINATE REDUCTION  11/20/2017   UPPER GI ENDOSCOPY N/A 07/12/2018   Procedure: UPPER ENDOSCOPY;  Surgeon: Clovis Riley, MD;  Location: [facility];  Service: General;  Laterality: N/A;   wisdom teeth extaction       OB History   No obstetric history on file.     Family History  Problem Relation Age of Onset   Breast cancer Mother        x 2   Cancer Mother    Diabetes Mother    Heart disease Father    Cancer Sister    Heart disease Maternal Grandmother    Heart disease Maternal Grandfather     Social History   Tobacco Use   Smoking status: Never   Smokeless tobacco: Never  Vaping Use   Vaping Use: Never used  Substance Use Topics   Alcohol use: Yes  Alcohol/week: 2.0 standard drinks    Types: 2 Glasses of wine per week    Comment: soc   Drug use: No    Home Medications Prior to Admission medications   Medication Sig Start Date End Date Taking? Authorizing Provider  acetaminophen (TYLENOL) 325 MG tablet Take 2 tablets (650 mg total) by mouth every 6 (six) hours as needed for mild pain, fever or headache. 06/04/18   Norm Parcel, PA-C  Calcium Citrate-Vitamin D (CALCIUM + D PO) Take 1 tablet by mouth daily.     [provider]  calcium-vitamin D (OSCAL WITH D) 500-200 MG-UNIT tablet Take 1 tablet by mouth daily.    [provider]  cetirizine (ZYRTEC) 5 MG tablet Take 5 mg by mouth daily.    [provider]  Cyanocobalamin (VITAMIN B-12 PO)  Take 1 tablet by mouth daily.     [provider]  docusate sodium (COLACE) 100 MG capsule Take 1 capsule (100 mg total) by mouth 2 (two) times daily as needed for mild constipation. 07/13/18   Maczis, Barth Kirks, PA-C  esomeprazole (NEXIUM) 20 MG capsule Take 20 mg by mouth 2 (two) times daily.    [provider]  fluticasone (FLONASE) 50 MCG/ACT nasal spray Place 1 spray into both nostrils daily.    [provider]  gabapentin (NEURONTIN) 300 MG capsule Take 1 capsule (300 mg total) by mouth 3 (three) times daily. 07/13/18   Maczis, Barth Kirks, PA-C  ibuprofen (ADVIL) 200 MG tablet Take 200 mg by mouth daily as needed for headache or moderate pain.    [provider]  losartan (COZAAR) 100 MG tablet Take 100 mg by mouth at bedtime.  05/28/18   [provider]  montelukast (SINGULAIR) 10 MG tablet Take 10 mg by mouth at bedtime.    [provider]  Multiple Vitamins-Minerals (MULTIVITAMIN GUMMIES ADULTS PO) Take 1 tablet by mouth daily.     [provider]  ondansetron (ZOFRAN) 4 MG tablet Take 1 tablet (4 mg total) by mouth every 6 (six) hours as needed for nausea or vomiting. 07/13/18   Maczis, Barth Kirks, PA-C  pantoprazole (PROTONIX) 40 MG tablet Take 1 tablet (40 mg total) by mouth daily. 07/14/18   Maczis, Barth Kirks, PA-C  sucralfate (CARAFATE) 1 g tablet Take 1 tablet (1 g total) by mouth 4 (four) times daily -  with meals and at bedtime for 7 days. Patient not taking: Reported on 07/09/2018 05/31/18 06/07/18  Henderly, Britni A, PA-C  traMADol (ULTRAM) 50 MG tablet Take 1 tablet (50 mg total) by mouth every 6 (six) hours as needed for severe pain. 07/13/18   Maczis, Barth Kirks, PA-C    Allergies    Patient has no known allergies.  Review of Systems   Review of Systems  Constitutional:  Negative for fever.  Skin:  Positive for wound.   Physical Exam Updated Vital Signs BP (!) 149/93 (BP Location: Right Arm)   Pulse 87   Temp 98.3 F  (36.8 C) (Oral)   Resp 16   Ht 5\' 7"  (1.702 m)   Wt 113.6 kg   LMP  (LMP Unknown)   SpO2 98%   BMI 39.22 kg/m   Physical Exam Vitals and nursing note reviewed.  Constitutional:      Appearance: Normal appearance.  HENT:     Head: Normocephalic and atraumatic.     Nose: Nose normal.  Eyes:     Pupils: Pupils are equal, round, and  reactive to light.  Cardiovascular:     Rate and Rhythm: Normal rate.  Pulmonary:     Effort: Pulmonary effort is normal.  Abdominal:     General: Abdomen is flat.  Musculoskeletal:     Cervical back: Normal range of motion and neck supple.  Skin:    General: Skin is warm.     Findings: Bruising and erythema present.          Comments: Please see photo attached.   Neurological:     Mental Status: She is alert and oriented to person, place, and time.     ED Results / Procedures / Treatments   Labs (all labs ordered are listed, but only abnormal results are displayed) Labs Reviewed - No data to display  EKG None  Radiology No results found.  Procedures Procedures   Medications Ordered in ED Medications  rabies immune globulin (HYPERAB/KEDRAB) injection 2,250 Units (2,250 Units Intramuscular Given 11/15/20 1115)  rabies vaccine (RABAVERT) injection 1 mL (1 mL Intramuscular Given 11/15/20 1117)    ED Course  I have reviewed the triage vital signs and the nursing notes.  Pertinent labs & imaging results that were available during my care of the patient were reviewed by me and considered in my medical decision making (see chart for details).    MDM Rules/Calculators/A&P    Patient presents to the ED status post dog bite x1 day.  Obtain prescription for antibiotics at urgent care, tetanus vaccine last given 4 years ago.  She is here for prophylactic rabies immunizations, as she was bit by a stranger's dog.  During evaluation there is no signs of puncture wound on my exam, however patient does report her underwear was torn and  likely she had a small bite mark.  I do not see any signs of deep penetration, I do not feel that imaging is warranted at this time.  She has cleaned this with alcohol.  We did discuss risks and benefits of obtaining rabies prophylactic vaccine at this time.  First dose will be given today along with immunoglobulin.  She will be sent to urgent care in order to get subsequent vaccines.  We see photo attached of the wound.  Return precautions discussed at length.  Patient is stable for discharge.  Patient was monitored for approximately 30 minutes after immunizations, no reactive noted. Discharged in stable condition.    Portions of this note were generated with [employer]. Dictation errors may occur despite best attempts at proofreading.  Final Clinical Impression(s) / ED Diagnoses Final diagnoses:  Dog bite, initial encounter    Rx / DC Orders ED Discharge Orders     None        Janeece Fitting, PA-C 11/15/20 Gifford, Viola, MD 11/15/20 [phone]

## 2020-11-15 NOTE — Discharge Instructions (Addendum)
You will need to begin taking the antibiotic prescribed by Urgent Care.   You received your first rabies vaccine on today's visit, you will need to continue with subsequent vaccines as listed on your discharge papers.  You may apply Neosporin or bacitracin to the area in order to help with healing.

## 2020-11-18 ENCOUNTER — Other Ambulatory Visit: Payer: Self-pay

## 2020-11-18 ENCOUNTER — Emergency Department
Admission: EM | Admit: 2020-11-18 | Discharge: 2020-11-18 | Disposition: A | Discharge status: Home / Self Care | Payer: POS | Source: Home / Self Care

## 2020-11-18 DIAGNOSIS — Z23 Encounter for immunization: Secondary | ICD-10-CM | POA: Diagnosis not present

## 2020-11-18 MED ORDER — RABIES VACCINE, PCEC IM SUSR
1.0000 mL | Freq: Once | INTRAMUSCULAR | Status: AC
  Administered 2020-11-18: 1 mL via INTRAMUSCULAR

## 2020-11-18 NOTE — ED Triage Notes (Signed)
Here for 2nd in series of rabies vaccine - given to left deltoid - no reaction w/ prior injection

## 2020-11-22 ENCOUNTER — Other Ambulatory Visit: Payer: Self-pay

## 2020-11-22 ENCOUNTER — Emergency Department (INDEPENDENT_AMBULATORY_CARE_PROVIDER_SITE_OTHER)
Admission: EM | Admit: 2020-11-22 | Discharge: 2020-11-22 | Disposition: A | Discharge status: Home / Self Care | Payer: POS | Source: Home / Self Care

## 2020-11-22 DIAGNOSIS — Z23 Encounter for immunization: Secondary | ICD-10-CM

## 2020-11-22 MED ORDER — RABIES VACCINE, PCEC IM SUSR
1.0000 mL | Freq: Once | INTRAMUSCULAR | Status: AC
  Administered 2020-11-22: 1 mL via INTRAMUSCULAR

## 2020-11-22 NOTE — ED Triage Notes (Signed)
Pt here today for 3rd rabies injection

## 2020-11-29 ENCOUNTER — Emergency Department
Admission: EM | Admit: 2020-11-29 | Discharge: 2020-11-29 | Disposition: A | Discharge status: Home / Self Care | Payer: POS | Source: Home / Self Care

## 2020-11-29 ENCOUNTER — Other Ambulatory Visit: Payer: Self-pay

## 2020-11-29 DIAGNOSIS — Z23 Encounter for immunization: Secondary | ICD-10-CM | POA: Diagnosis not present

## 2020-11-29 DIAGNOSIS — Z203 Contact with and (suspected) exposure to rabies: Secondary | ICD-10-CM

## 2020-11-29 MED ORDER — RABIES VACCINE, PCEC IM SUSR
1.0000 mL | Freq: Once | INTRAMUSCULAR | Status: AC
  Administered 2020-11-29: 1 mL via INTRAMUSCULAR

## 2020-11-29 NOTE — ED Triage Notes (Signed)
Pt here today for final rabies injection

## 2021-02-21 IMAGING — DX ABDOMEN - 1 VIEW
1 series · 1 of 1 positions shown · non-contrast
Comparison: None.

CLINICAL DATA: NG tube placement.

EXAM:
ABDOMEN - 1 VIEW

[abdomen kub]
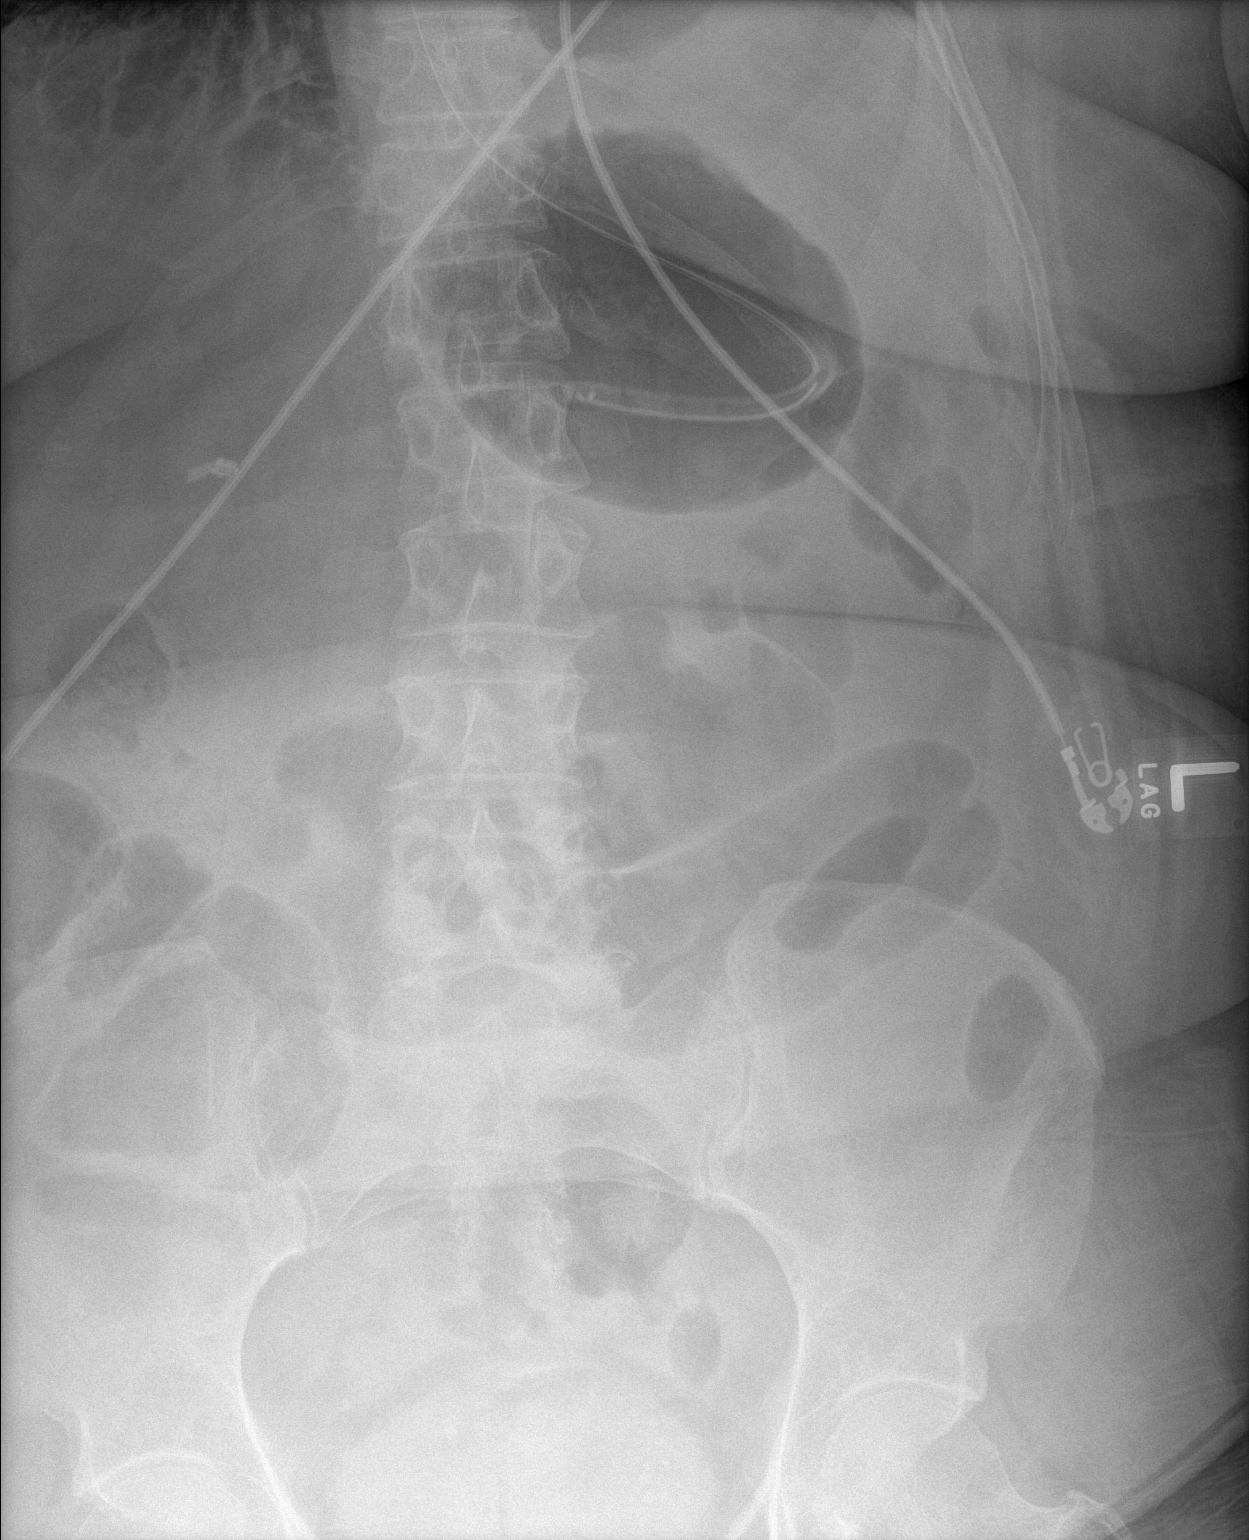

[1 of 1 positions shown; findings below may reference images not displayed]

FINDINGS: NG tube is in place with the tip and side-port in the stomach.
IMPRESSION: NG tube in good position.

## 2021-02-22 IMAGING — DX PORTABLE ABDOMEN - 2 VIEW
3 series · 3 of 3 positions shown · non-contrast
Comparison: Radiograph June 01, 2018.

CLINICAL DATA: Upper abdominal pain.

EXAM:
PORTABLE ABDOMEN - 2 VIEW

[abdomen erect]
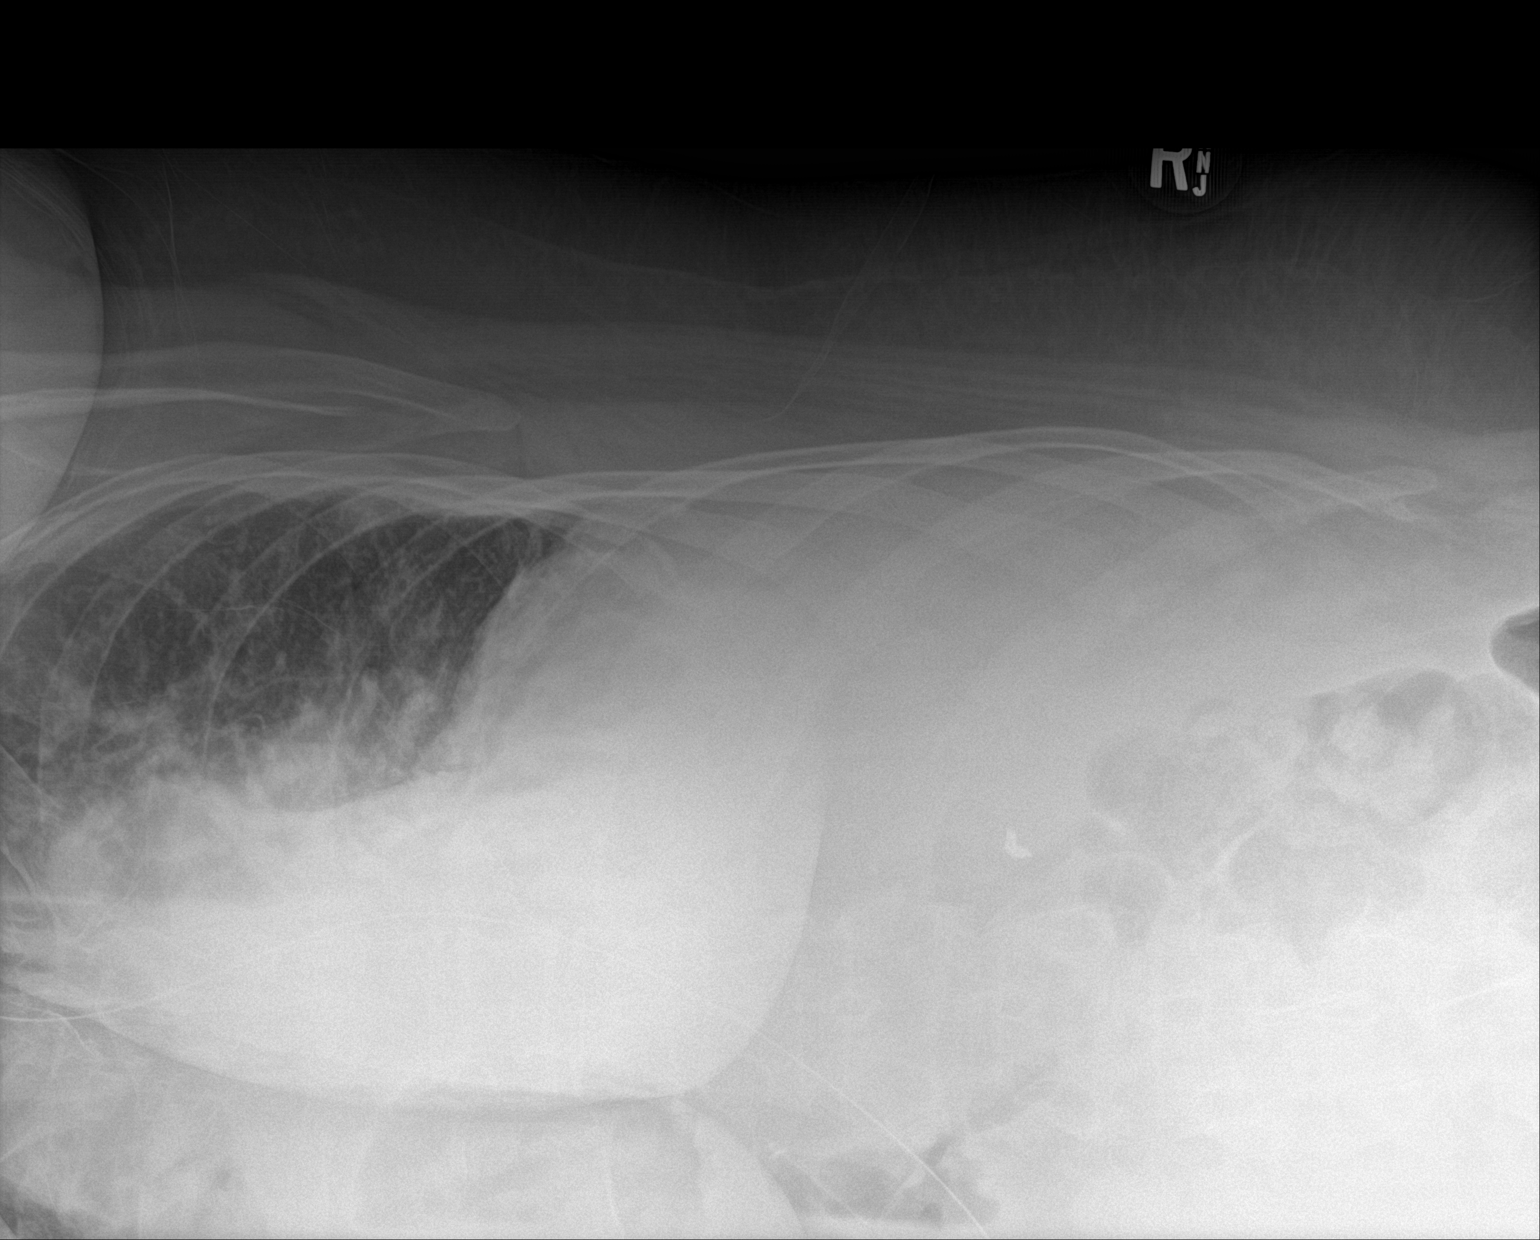

[abdomen supine (1 of 2)]
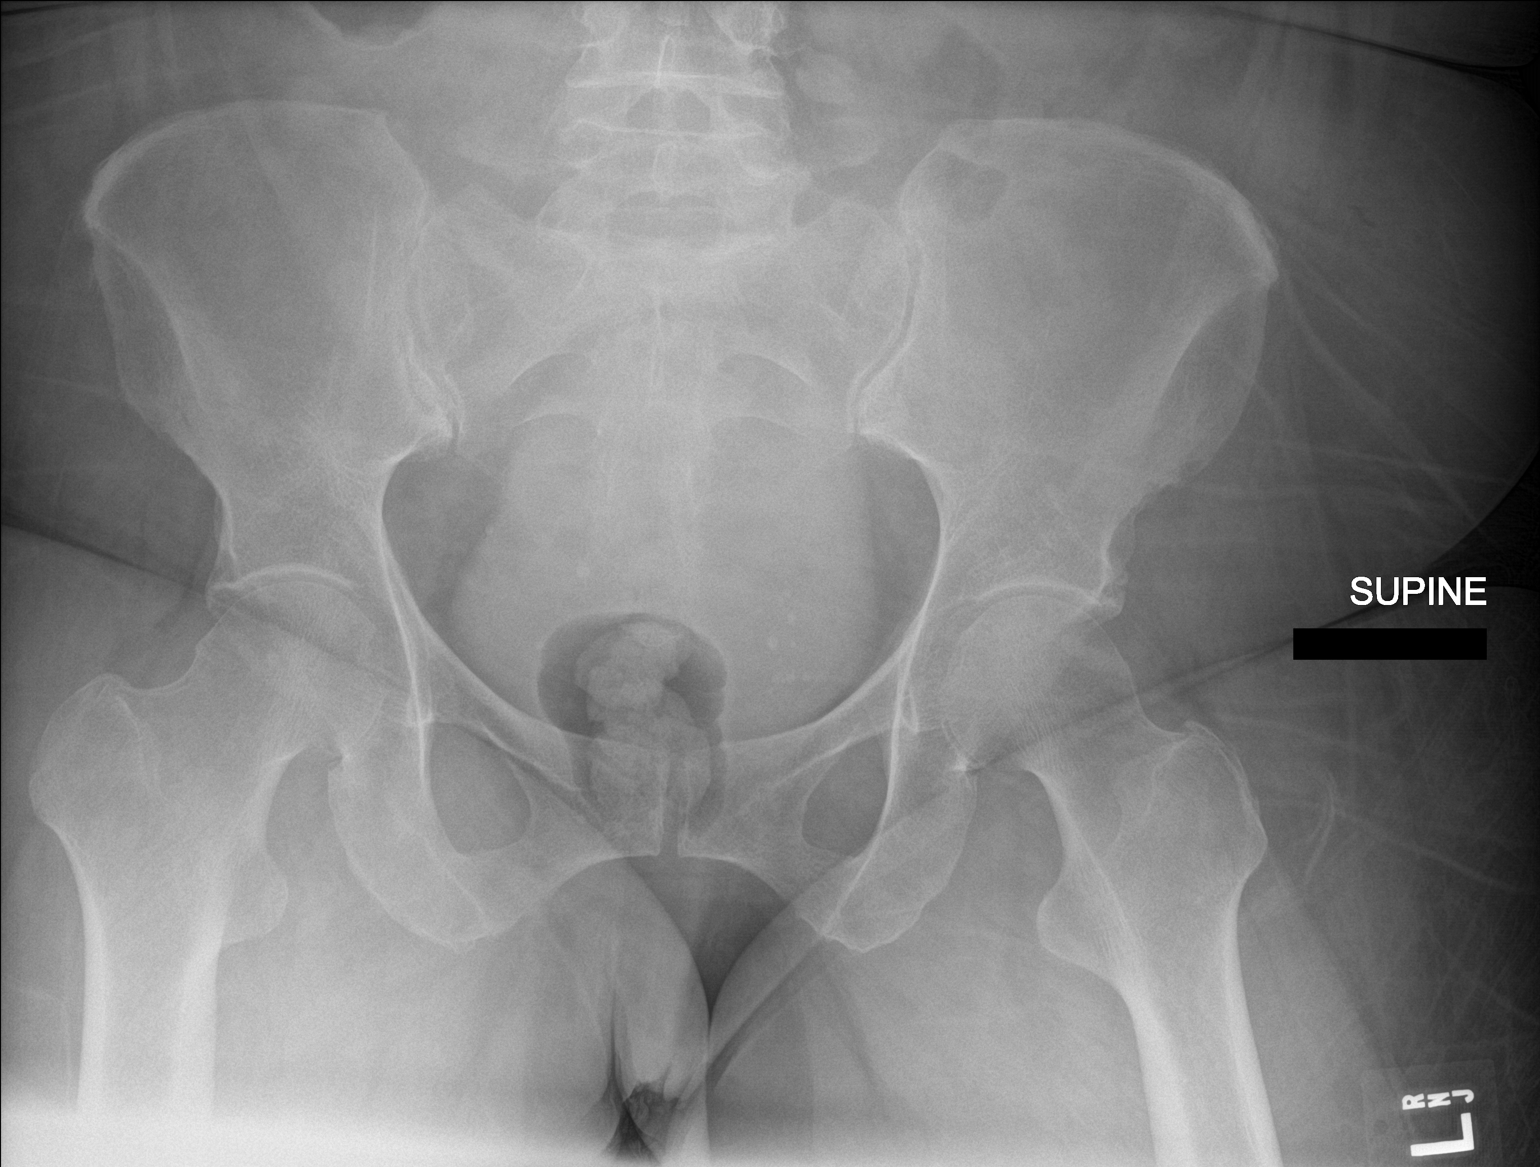

[abdomen supine (2 of 2)]
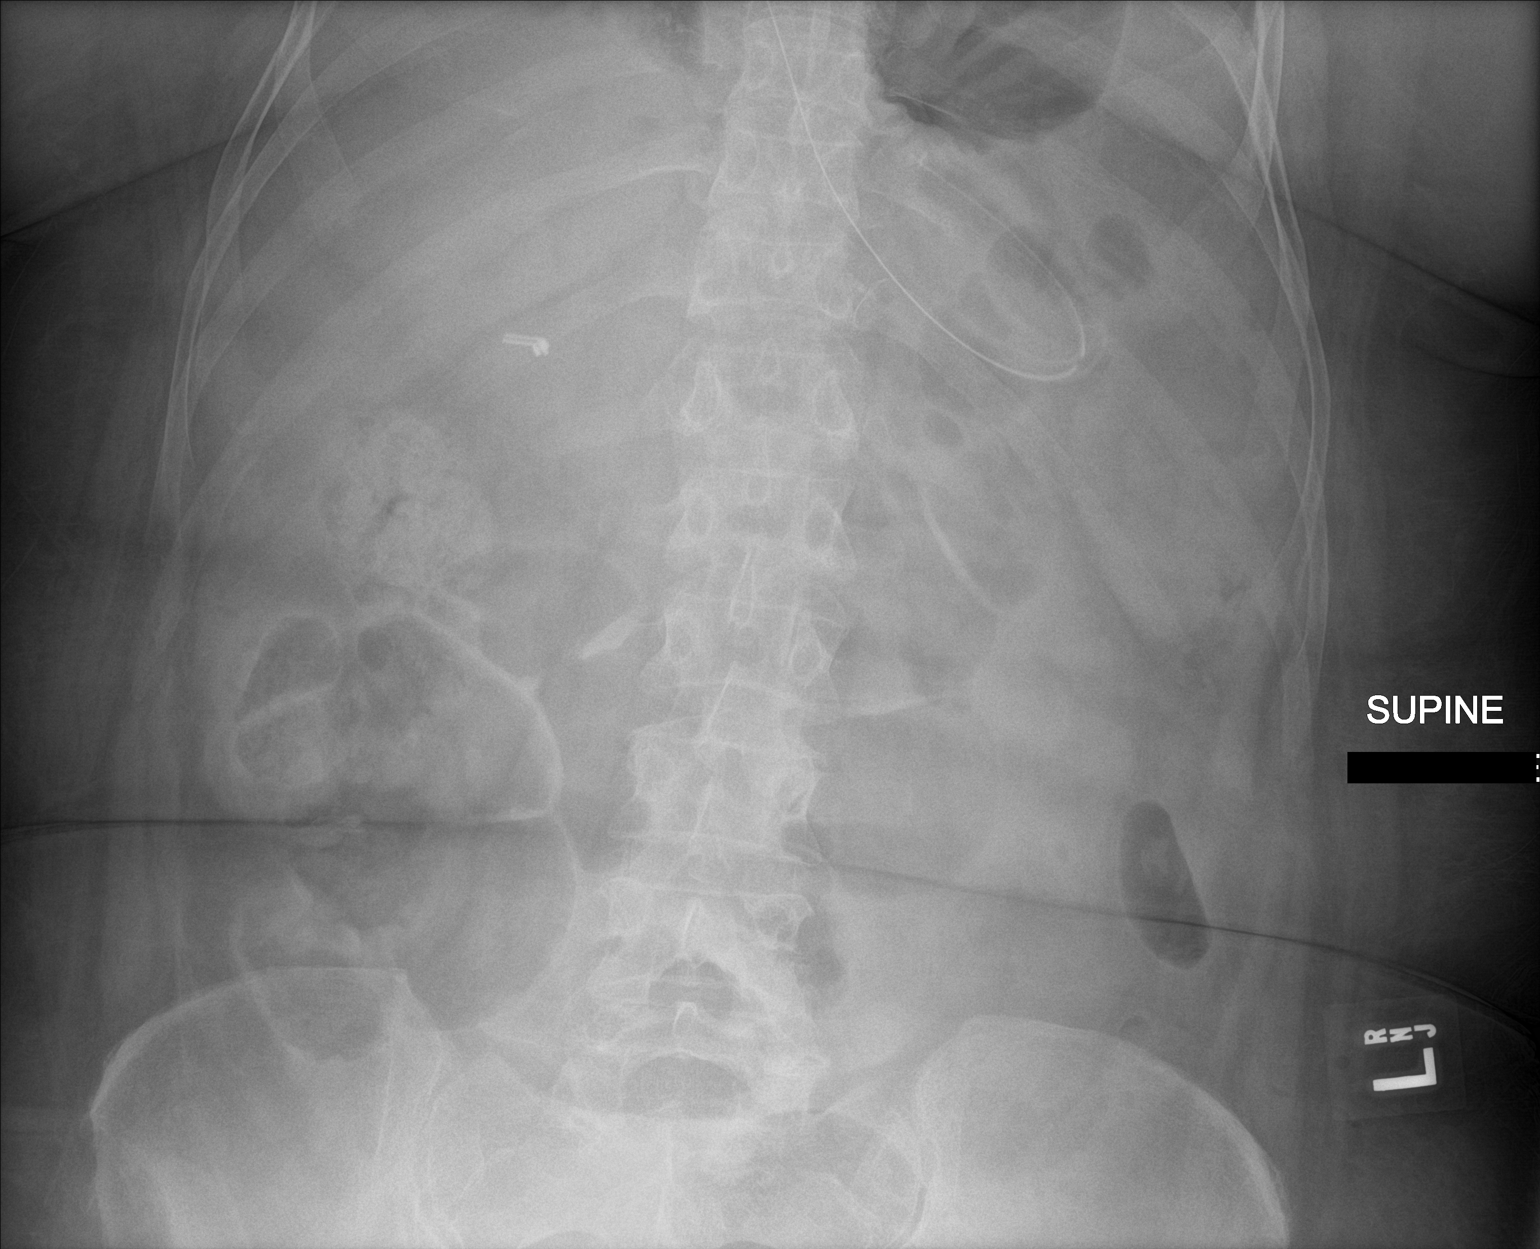

[3 of 3 positions shown; findings below may reference images not displayed]

FINDINGS: The bowel gas pattern is normal. There is no evidence of free air.
Nasogastric tube is looped within proximal stomach. Status post
cholecystectomy. Phleboliths are noted in the pelvis.
IMPRESSION: Nasogastric tube seen in proximal stomach. No evidence of bowel
obstruction or ileus.

## 2021-02-24 IMAGING — DX PORTABLE CHEST - 1 VIEW
1 series · 1 of 1 positions shown · non-contrast
Comparison: Chest radiographs 06/01/2018 and earlier.

CLINICAL DATA: 52-year-old female with hiatal hernia. Intermittent
obstruction. Query gastric distension.

EXAM:
PORTABLE CHEST 1 VIEW

[chest ap]
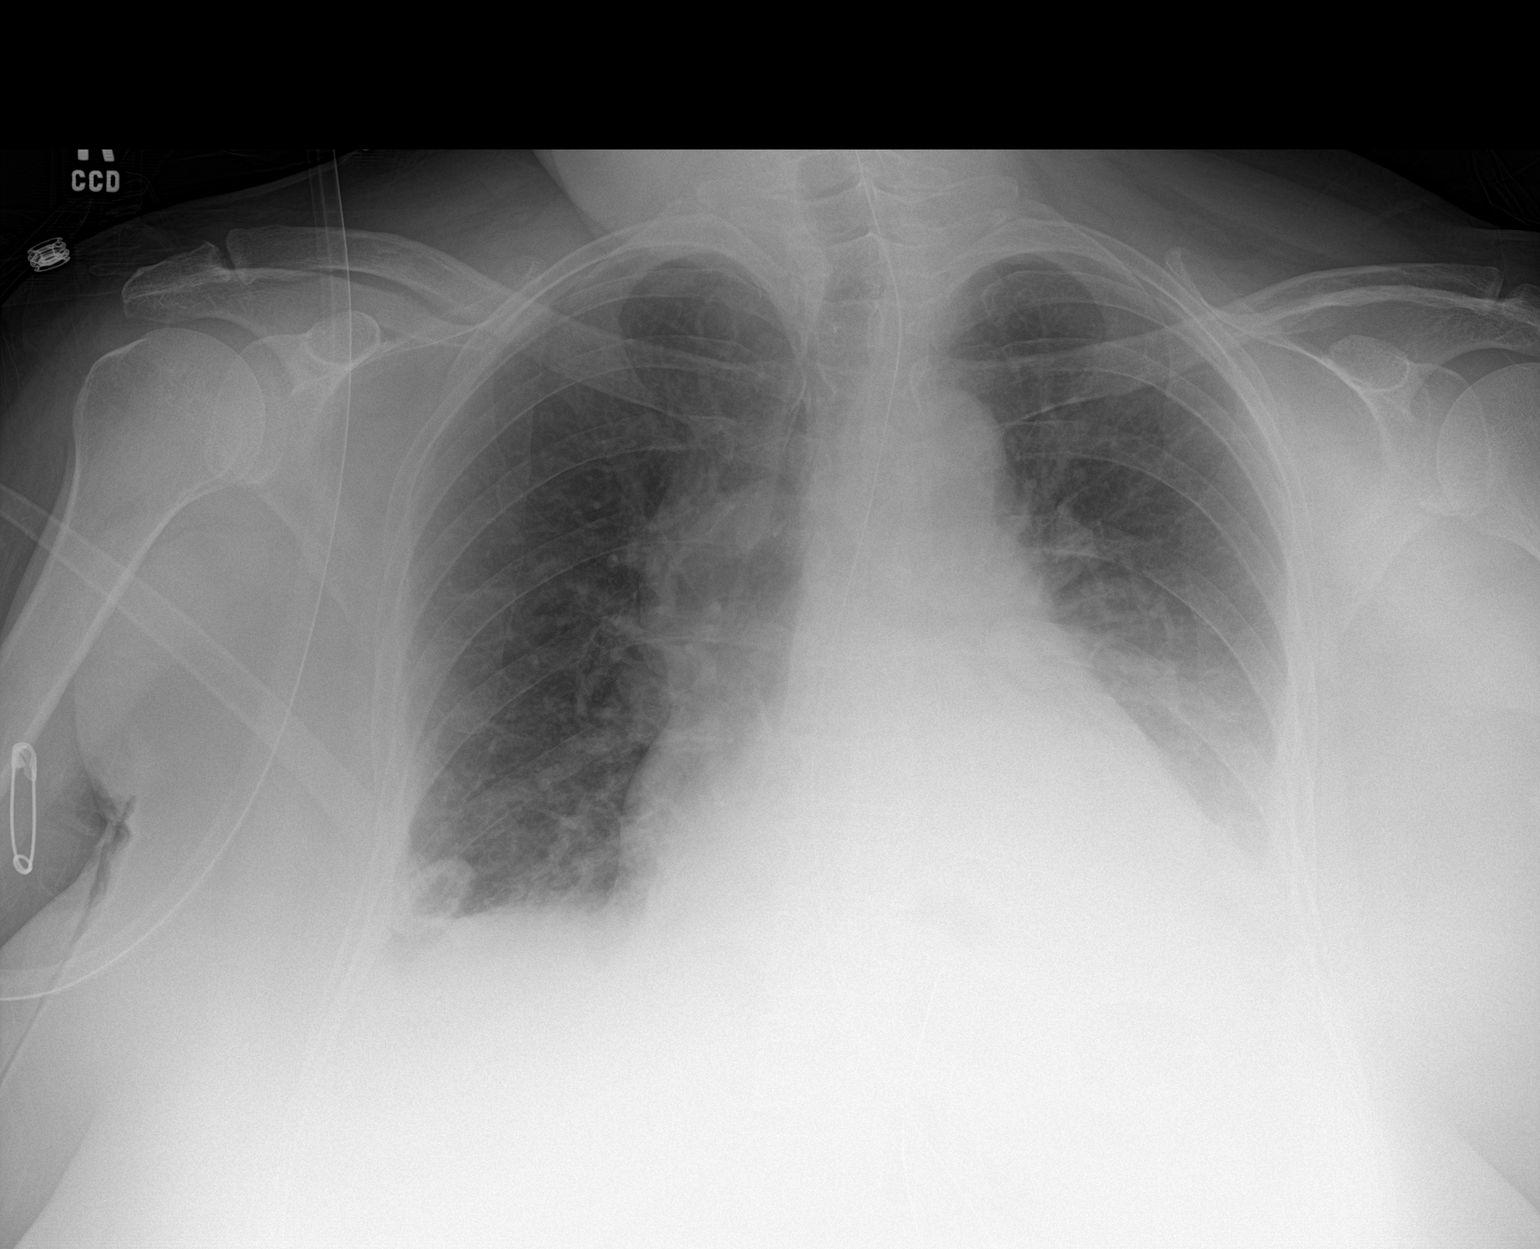

[1 of 1 positions shown; findings below may reference images not displayed]

FINDINGS: Portable AP semi upright view at 2955 hours. Enteric tube remains in
place and is looped in the left upper quadrant similar to the prior.
There is now a paucity of bowel gas both in the hiatal hernia and
the upper abdomen. No gastric distension is evident.

Other mediastinal contours are within normal limits. Stable
ventilation. No pneumothorax.
IMPRESSION: 1. Enteric tube appears stable.
2. Paucity of gastric air both in the hernia and the upper abdomen.
No gastric distention is evident.
3. Stable ventilation.

## 2021-12-02 ENCOUNTER — Other Ambulatory Visit (HOSPITAL_BASED_OUTPATIENT_CLINIC_OR_DEPARTMENT_OTHER): Payer: Self-pay | Admitting: Obstetrics and Gynecology

## 2021-12-02 DIAGNOSIS — Z1231 Encounter for screening mammogram for malignant neoplasm of breast: Secondary | ICD-10-CM

## 2022-04-01 ENCOUNTER — Other Ambulatory Visit (HOSPITAL_BASED_OUTPATIENT_CLINIC_OR_DEPARTMENT_OTHER): Payer: Self-pay

## 2022-04-01 MED ORDER — WEGOVY 0.25 MG/0.5ML ~~LOC~~ SOAJ
0.2500 mg | SUBCUTANEOUS | 2 refills | Status: AC
  Filled 2022-04-01: qty 2, 28d supply, fill #0

## 2022-04-03 ENCOUNTER — Other Ambulatory Visit (HOSPITAL_BASED_OUTPATIENT_CLINIC_OR_DEPARTMENT_OTHER): Payer: Self-pay

## 2022-04-08 ENCOUNTER — Ambulatory Visit (HOSPITAL_BASED_OUTPATIENT_CLINIC_OR_DEPARTMENT_OTHER)
Admission: RE | Admit: 2022-04-08 | Discharge: 2022-04-08 | Disposition: A | Discharge status: Home / Self Care | Payer: 59 | Source: Ambulatory Visit | Attending: Obstetrics and Gynecology | Admitting: Obstetrics and Gynecology

## 2022-04-08 ENCOUNTER — Encounter (HOSPITAL_BASED_OUTPATIENT_CLINIC_OR_DEPARTMENT_OTHER): Payer: Self-pay

## 2022-04-08 DIAGNOSIS — Z1231 Encounter for screening mammogram for malignant neoplasm of breast: Secondary | ICD-10-CM | POA: Insufficient documentation

## 2022-09-04 ENCOUNTER — Other Ambulatory Visit (HOSPITAL_BASED_OUTPATIENT_CLINIC_OR_DEPARTMENT_OTHER): Payer: Self-pay

## 2022-09-04 MED ORDER — WEGOVY 0.5 MG/0.5ML ~~LOC~~ SOAJ
0.5000 mL | SUBCUTANEOUS | 0 refills | Status: AC
  Filled 2022-09-04: qty 2, 28d supply, fill #0

## 2022-10-07 ENCOUNTER — Other Ambulatory Visit (HOSPITAL_BASED_OUTPATIENT_CLINIC_OR_DEPARTMENT_OTHER): Payer: Self-pay

## 2022-10-07 MED ORDER — WEGOVY 1 MG/0.5ML ~~LOC~~ SOAJ
1.0000 mg | SUBCUTANEOUS | 0 refills | Status: DC
  Filled 2022-10-07: qty 2, 28d supply, fill #0

## 2022-10-24 ENCOUNTER — Other Ambulatory Visit: Payer: Self-pay

## 2022-10-24 ENCOUNTER — Other Ambulatory Visit (HOSPITAL_BASED_OUTPATIENT_CLINIC_OR_DEPARTMENT_OTHER): Payer: Self-pay

## 2022-10-24 MED ORDER — WEGOVY 1 MG/0.5ML ~~LOC~~ SOAJ
1.0000 mg | SUBCUTANEOUS | 2 refills | Status: DC
  Filled 2022-10-24 – 2022-11-03 (×2): qty 2, 28d supply, fill #0

## 2022-10-24 MED ORDER — PHENTERMINE HCL 37.5 MG PO TABS
37.5000 mg | ORAL_TABLET | Freq: Every day | ORAL | 2 refills | Status: DC
  Filled 2022-10-24: qty 30, 30d supply, fill #0
  Filled 2022-12-19: qty 30, 30d supply, fill #1
  Filled 2023-03-06: qty 30, 30d supply, fill #2

## 2022-11-03 ENCOUNTER — Other Ambulatory Visit (HOSPITAL_BASED_OUTPATIENT_CLINIC_OR_DEPARTMENT_OTHER): Payer: Self-pay

## 2022-12-05 ENCOUNTER — Other Ambulatory Visit (HOSPITAL_BASED_OUTPATIENT_CLINIC_OR_DEPARTMENT_OTHER): Payer: Self-pay

## 2022-12-05 MED ORDER — WEGOVY 1.7 MG/0.75ML ~~LOC~~ SOAJ
0.7500 mL | SUBCUTANEOUS | 2 refills | Status: DC
  Filled 2022-12-05: qty 3, 28d supply, fill #0
  Filled 2023-01-06: qty 3, 28d supply, fill #1
  Filled 2023-02-06: qty 3, 28d supply, fill #2

## 2022-12-19 ENCOUNTER — Other Ambulatory Visit: Payer: Self-pay

## 2023-01-06 ENCOUNTER — Other Ambulatory Visit (HOSPITAL_BASED_OUTPATIENT_CLINIC_OR_DEPARTMENT_OTHER): Payer: Self-pay

## 2023-03-06 ENCOUNTER — Other Ambulatory Visit: Payer: Self-pay

## 2023-03-06 ENCOUNTER — Other Ambulatory Visit (HOSPITAL_BASED_OUTPATIENT_CLINIC_OR_DEPARTMENT_OTHER): Payer: Self-pay

## 2023-03-13 ENCOUNTER — Other Ambulatory Visit (HOSPITAL_BASED_OUTPATIENT_CLINIC_OR_DEPARTMENT_OTHER): Payer: Self-pay

## 2023-03-15 MED ORDER — SEMAGLUTIDE-WEIGHT MANAGEMENT 1.7 MG/0.75ML ~~LOC~~ SOAJ
0.7500 mL | SUBCUTANEOUS | 0 refills | Status: DC
  Filled 2023-03-15: qty 3, 28d supply, fill #0

## 2023-03-16 ENCOUNTER — Other Ambulatory Visit (HOSPITAL_BASED_OUTPATIENT_CLINIC_OR_DEPARTMENT_OTHER): Payer: Self-pay
